# Patient Record
Sex: Male | Born: 1970 | Race: White | Hispanic: No | Marital: Married | Smoking: Former smoker
Health system: Southern US, Community
[De-identification: ages and names within clinical notes are randomized; demographics above are authoritative.]

## PROBLEM LIST (undated history)

## (undated) DIAGNOSIS — I10 Essential (primary) hypertension: Secondary | ICD-10-CM

## (undated) DIAGNOSIS — F319 Bipolar disorder, unspecified: Secondary | ICD-10-CM

## (undated) DIAGNOSIS — K219 Gastro-esophageal reflux disease without esophagitis: Secondary | ICD-10-CM

## (undated) DIAGNOSIS — F418 Other specified anxiety disorders: Secondary | ICD-10-CM

## (undated) DIAGNOSIS — F1010 Alcohol abuse, uncomplicated: Secondary | ICD-10-CM

## (undated) DIAGNOSIS — E7800 Pure hypercholesterolemia, unspecified: Secondary | ICD-10-CM

## (undated) HISTORY — PX: TONSILLECTOMY: SUR1361

---

## 2017-11-26 ENCOUNTER — Ambulatory Visit
Admission: EM | Admit: 2017-11-26 | Discharge: 2017-11-26 | Disposition: A | Discharge status: Home / Self Care | Payer: 59 | Attending: Family Medicine | Admitting: Family Medicine

## 2017-11-26 ENCOUNTER — Encounter: Payer: Self-pay | Admitting: *Deleted

## 2017-11-26 DIAGNOSIS — J029 Acute pharyngitis, unspecified: Secondary | ICD-10-CM | POA: Diagnosis not present

## 2017-11-26 DIAGNOSIS — H9202 Otalgia, left ear: Secondary | ICD-10-CM

## 2017-11-26 HISTORY — DX: Bipolar disorder, unspecified: F31.9

## 2017-11-26 LAB — RAPID STREP SCREEN (MED CTR MEBANE ONLY): Streptococcus, Group A Screen (Direct): NEGATIVE

## 2017-11-26 MED ORDER — AMOXICILLIN-POT CLAVULANATE 875-125 MG PO TABS: 1.0000 | ORAL_TABLET | Freq: Two times a day (BID) | ORAL | 0 refills | Status: DC

## 2017-11-26 NOTE — ED Provider Notes (Signed)
[facility]    CSN: [id number] Arrival date & time: 11/26/17  0911     History   Chief Complaint Chief Complaint  Patient presents with  . Sore Throat  . Otalgia    HPI Jesse Garrett is a 47 y.o. male.   HPI  47 year old male presents with a sore throat and left-sided ear pain for 4-5 days.  She does not have any discomfort on the right side.  Very sore throat hurts to swallow.  In addition he has had pain over his lower gum and jaw line.  Had no fever or chills.  Mother was seen here yesterday and diagnosed with strep throat.  He does stay with her 4 days a week.  Has been using ibuprofen 800 mg throughout 3 times daily.      Past Medical History:  Diagnosis Date  . Bipolar 1 disorder (HCC)     There are no active problems to display for this patient.   Past Surgical History:  Procedure Laterality Date  . TONSILLECTOMY         Home Medications    Prior to Admission medications   Medication Sig Start Date End Date Taking? Authorizing Provider  gabapentin (NEURONTIN) 300 MG capsule Take 900 mg by mouth 3 (three) times daily.   Yes [provider]  lamoTRIgine (LAMICTAL) 25 MG tablet Take 50 mg by mouth daily.   Yes [provider]  zolpidem (AMBIEN) 5 MG tablet Take 10 mg by mouth at bedtime as needed for sleep.   Yes [provider]  amoxicillin-clavulanate (AUGMENTIN) 875-125 MG tablet Take 1 tablet by mouth every 12 (twelve) hours. 11/26/17   Lutricia Feiloemer, Alverta Caccamo P, PA-C    Family History Family History  Problem Relation Age of Onset  . Diabetes Mother   . Hypertension Mother   . Cancer Father     Social History Social History   Tobacco Use  . Smoking status: Former Games developermoker  . Smokeless tobacco: Never Used  Substance Use Topics  . Alcohol use: No    Frequency: Never  . Drug use: No     Allergies   Patient has no known allergies.   Review of Systems Review of Systems  Constitutional: Positive for activity  change. Negative for chills, fatigue and fever.  HENT: Positive for ear pain, facial swelling and sore throat.   Respiratory: Negative for cough.   All other systems reviewed and are negative.    Physical Exam Triage Vital Signs ED Triage Vitals  Enc Vitals Group     BP 11/26/17 0935 137/87     Pulse Rate 11/26/17 0935 84     Resp 11/26/17 0935 16     Temp 11/26/17 0935 (!) 97.5 F (36.4 C)     Temp Source 11/26/17 0935 Oral     SpO2 11/26/17 0935 100 %     Weight 11/26/17 0938 235 lb (106.6 kg)     Height 11/26/17 0938 6' (1.829 m)     Head Circumference --      Peak Flow --      Pain Score 11/26/17 1102 0     Pain Loc --      Pain Edu? --      Excl. in GC? --    No data found.  Updated Vital Signs BP 137/87 (BP Location: Left Arm)   Pulse 84   Temp (!) 97.5 F (36.4 C) (Oral)   Resp 16   Ht 6' (1.829 m)  Wt 235 lb (106.6 kg)   SpO2 100%   BMI 31.87 kg/m   Visual Acuity Right Eye Distance:   Left Eye Distance:   Bilateral Distance:    Right Eye Near:   Left Eye Near:    Bilateral Near:     Physical Exam  Constitutional: He is oriented to person, place, and time. He appears well-developed and well-nourished.  Non-toxic appearance. He does not appear ill. No distress.  HENT:  Head: Normocephalic.  Right Ear: Hearing normal.  Left Ear: Hearing normal.  Mouth/Throat: Oropharynx is clear and moist and mucous membranes are normal. No oral lesions. No uvula swelling. No oropharyngeal exudate, posterior oropharyngeal edema, posterior oropharyngeal erythema or tonsillar abscesses. Tonsils are 1+ on the right. Tonsils are 1+ on the left. No tonsillar exudate.  TMs have sclerosis which is much worse on the left.  Right TM has bullae on TM.  Patient does have tenderness along the lower jaw line on the left and mild swelling appreciated of the left cheek.  No obvious abscess is seen.  Teeth are in fairly good repair with several caries noted scattered.  Eyes: Pupils are  equal, round, and reactive to light.  Neck: Normal range of motion. Neck supple.  Pulmonary/Chest: Effort normal and breath sounds normal.  Lymphadenopathy:    He has no cervical adenopathy.  Neurological: He is alert and oriented to person, place, and time.  Skin: Skin is warm and dry.  Psychiatric: He has a normal mood and affect. His behavior is normal.  Nursing note and vitals reviewed.    UC Treatments / Results  Labs (all labs ordered are listed, but only abnormal results are displayed) Labs Reviewed  RAPID STREP SCREEN (NOT AT [facility])  CULTURE, GROUP A STREP [facility])    EKG  EKG Interpretation None       Radiology No results found.  Procedures Procedures (including critical care time)  Medications Ordered in UC Medications - No data to display   Initial Impression / Assessment and Plan / UC Course  I have reviewed the triage vital signs and the nursing notes.  Pertinent labs & imaging results that were available during my care of the patient were reviewed by me and considered in my medical decision making (see chart for details).     Plan: 1. Test/x-ray results and diagnosis reviewed with patient 2. rx as per orders; risks, benefits, potential side effects reviewed with patient 3. Recommend supportive treatment with rest fluids and continued use of ibuprofen 800 mg 3 times a day with food.  Because of his exposure to strep with his mother also the tenderness along the jawline on the left I will start him on Augmentin empirically awaiting results of the pharyngeal cultures and sensitivities. 4. F/u prn if symptoms worsen or don't improve   Final Clinical Impressions(s) / UC Diagnoses   Final diagnoses:  Sore throat    ED Discharge Orders        Ordered    amoxicillin-clavulanate (AUGMENTIN) 875-125 MG tablet  Every 12 hours     11/26/17 1059       Controlled Substance Prescriptions [place] Controlled Substance Registry consulted? Not Applicable     Lutricia Feil, PA-C 11/26/17 2049

## 2017-11-26 NOTE — ED Triage Notes (Signed)
Sore throat and left ear pain x4-5 days.

## 2017-11-28 LAB — CULTURE, GROUP A STREP (THRC)

## 2020-08-12 ENCOUNTER — Emergency Department: Payer: Medicaid Other

## 2020-08-12 ENCOUNTER — Inpatient Hospital Stay: Payer: Medicaid Other

## 2020-08-12 ENCOUNTER — Inpatient Hospital Stay
Admission: EM | Admit: 2020-08-12 | Discharge: 2020-08-15 | DRG: 471 | Disposition: A | Discharge status: Home Health | Payer: Medicaid Other | Attending: Internal Medicine | Admitting: Internal Medicine

## 2020-08-12 ENCOUNTER — Other Ambulatory Visit: Payer: Self-pay

## 2020-08-12 DIAGNOSIS — K219 Gastro-esophageal reflux disease without esophagitis: Secondary | ICD-10-CM | POA: Diagnosis present

## 2020-08-12 DIAGNOSIS — F419 Anxiety disorder, unspecified: Secondary | ICD-10-CM | POA: Diagnosis present

## 2020-08-12 DIAGNOSIS — Z8249 Family history of ischemic heart disease and other diseases of the circulatory system: Secondary | ICD-10-CM

## 2020-08-12 DIAGNOSIS — M542 Cervicalgia: Secondary | ICD-10-CM | POA: Diagnosis present

## 2020-08-12 DIAGNOSIS — S12500A Unspecified displaced fracture of sixth cervical vertebra, initial encounter for closed fracture: Principal | ICD-10-CM | POA: Diagnosis present

## 2020-08-12 DIAGNOSIS — I959 Hypotension, unspecified: Secondary | ICD-10-CM | POA: Diagnosis present

## 2020-08-12 DIAGNOSIS — F1010 Alcohol abuse, uncomplicated: Secondary | ICD-10-CM | POA: Diagnosis present

## 2020-08-12 DIAGNOSIS — Z91030 Bee allergy status: Secondary | ICD-10-CM

## 2020-08-12 DIAGNOSIS — E785 Hyperlipidemia, unspecified: Secondary | ICD-10-CM | POA: Diagnosis present

## 2020-08-12 DIAGNOSIS — S62001A Unspecified fracture of navicular [scaphoid] bone of right wrist, initial encounter for closed fracture: Secondary | ICD-10-CM | POA: Diagnosis present

## 2020-08-12 DIAGNOSIS — S2232XA Fracture of one rib, left side, initial encounter for closed fracture: Secondary | ICD-10-CM | POA: Diagnosis present

## 2020-08-12 DIAGNOSIS — F319 Bipolar disorder, unspecified: Secondary | ICD-10-CM | POA: Diagnosis present

## 2020-08-12 DIAGNOSIS — S12600A Unspecified displaced fracture of seventh cervical vertebra, initial encounter for closed fracture: Secondary | ICD-10-CM | POA: Diagnosis present

## 2020-08-12 DIAGNOSIS — Z79899 Other long term (current) drug therapy: Secondary | ICD-10-CM

## 2020-08-12 DIAGNOSIS — F10120 Alcohol abuse with intoxication, uncomplicated: Secondary | ICD-10-CM | POA: Diagnosis present

## 2020-08-12 DIAGNOSIS — Z20822 Contact with and (suspected) exposure to covid-19: Secondary | ICD-10-CM | POA: Diagnosis present

## 2020-08-12 DIAGNOSIS — Z833 Family history of diabetes mellitus: Secondary | ICD-10-CM | POA: Diagnosis not present

## 2020-08-12 DIAGNOSIS — Y92410 Unspecified street and highway as the place of occurrence of the external cause: Secondary | ICD-10-CM

## 2020-08-12 DIAGNOSIS — S12601A Unspecified nondisplaced fracture of seventh cervical vertebra, initial encounter for closed fracture: Secondary | ICD-10-CM | POA: Diagnosis not present

## 2020-08-12 DIAGNOSIS — M532X2 Spinal instabilities, cervical region: Secondary | ICD-10-CM | POA: Diagnosis present

## 2020-08-12 DIAGNOSIS — Z87891 Personal history of nicotine dependence: Secondary | ICD-10-CM

## 2020-08-12 DIAGNOSIS — J9601 Acute respiratory failure with hypoxia: Secondary | ICD-10-CM | POA: Diagnosis present

## 2020-08-12 DIAGNOSIS — E876 Hypokalemia: Secondary | ICD-10-CM | POA: Diagnosis not present

## 2020-08-12 DIAGNOSIS — G9600 Cerebrospinal fluid leak, unspecified: Secondary | ICD-10-CM | POA: Diagnosis present

## 2020-08-12 DIAGNOSIS — D72829 Elevated white blood cell count, unspecified: Secondary | ICD-10-CM | POA: Diagnosis present

## 2020-08-12 DIAGNOSIS — Y908 Blood alcohol level of 240 mg/100 ml or more: Secondary | ICD-10-CM | POA: Diagnosis present

## 2020-08-12 DIAGNOSIS — Z981 Arthrodesis status: Secondary | ICD-10-CM

## 2020-08-12 DIAGNOSIS — S62009A Unspecified fracture of navicular [scaphoid] bone of unspecified wrist, initial encounter for closed fracture: Secondary | ICD-10-CM | POA: Diagnosis present

## 2020-08-12 DIAGNOSIS — I10 Essential (primary) hypertension: Secondary | ICD-10-CM

## 2020-08-12 DIAGNOSIS — S12400A Unspecified displaced fracture of fifth cervical vertebra, initial encounter for closed fracture: Secondary | ICD-10-CM | POA: Diagnosis present

## 2020-08-12 DIAGNOSIS — Z809 Family history of malignant neoplasm, unspecified: Secondary | ICD-10-CM

## 2020-08-12 DIAGNOSIS — E7800 Pure hypercholesterolemia, unspecified: Secondary | ICD-10-CM | POA: Diagnosis present

## 2020-08-12 DIAGNOSIS — F418 Other specified anxiety disorders: Secondary | ICD-10-CM | POA: Diagnosis not present

## 2020-08-12 DIAGNOSIS — S022XXA Fracture of nasal bones, initial encounter for closed fracture: Secondary | ICD-10-CM | POA: Diagnosis present

## 2020-08-12 DIAGNOSIS — S12591A Other nondisplaced fracture of sixth cervical vertebra, initial encounter for closed fracture: Secondary | ICD-10-CM

## 2020-08-12 DIAGNOSIS — Z419 Encounter for procedure for purposes other than remedying health state, unspecified: Secondary | ICD-10-CM

## 2020-08-12 DIAGNOSIS — S129XXA Fracture of neck, unspecified, initial encounter: Secondary | ICD-10-CM | POA: Diagnosis present

## 2020-08-12 HISTORY — DX: Pure hypercholesterolemia, unspecified: E78.00

## 2020-08-12 HISTORY — DX: Alcohol abuse, uncomplicated: F10.10

## 2020-08-12 HISTORY — DX: Essential (primary) hypertension: I10

## 2020-08-12 HISTORY — DX: Other specified anxiety disorders: F41.8

## 2020-08-12 HISTORY — DX: Gastro-esophageal reflux disease without esophagitis: K21.9

## 2020-08-12 LAB — URINALYSIS, COMPLETE (UACMP) WITH MICROSCOPIC
Bacteria, UA: NONE SEEN
Bilirubin Urine: NEGATIVE
Glucose, UA: NEGATIVE mg/dL
Hgb urine dipstick: NEGATIVE
Ketones, ur: NEGATIVE mg/dL
Leukocytes,Ua: NEGATIVE
Nitrite: NEGATIVE
Protein, ur: NEGATIVE mg/dL
Specific Gravity, Urine: 1.031 — ABNORMAL HIGH (ref 1.005–1.030)
Squamous Epithelial / LPF: NONE SEEN (ref 0–5)
pH: 8 (ref 5.0–8.0)

## 2020-08-12 LAB — CBC
HCT: 41.2 % (ref 39.0–52.0)
Hemoglobin: 14.1 g/dL (ref 13.0–17.0)
MCH: 33.1 pg (ref 26.0–34.0)
MCHC: 34.2 g/dL (ref 30.0–36.0)
MCV: 96.7 fL (ref 80.0–100.0)
Platelets: 322 10*3/uL (ref 150–400)
RBC: 4.26 MIL/uL (ref 4.22–5.81)
RDW: 13 % (ref 11.5–15.5)
WBC: 11.3 10*3/uL — ABNORMAL HIGH (ref 4.0–10.5)
nRBC: 0 % (ref 0.0–0.2)

## 2020-08-12 LAB — COMPREHENSIVE METABOLIC PANEL
ALT: 31 U/L (ref 0–44)
AST: 65 U/L — ABNORMAL HIGH (ref 15–41)
Albumin: 4.2 g/dL (ref 3.5–5.0)
Alkaline Phosphatase: 54 U/L (ref 38–126)
Anion gap: 9 (ref 5–15)
BUN: 10 mg/dL (ref 6–20)
CO2: 24 mmol/L (ref 22–32)
Calcium: 9.8 mg/dL (ref 8.9–10.3)
Chloride: 104 mmol/L (ref 98–111)
Creatinine, Ser: 1.22 mg/dL (ref 0.61–1.24)
GFR, Estimated: >60 mL/min (ref >60)
Glucose, Bld: 107 mg/dL — ABNORMAL HIGH (ref 70–99)
Potassium: 3.7 mmol/L (ref 3.5–5.1)
Sodium: 137 mmol/L (ref 135–145)
Total Bilirubin: 0.5 mg/dL (ref 0.3–1.2)
Total Protein: 7.7 g/dL (ref 6.5–8.1)

## 2020-08-12 LAB — ETHANOL: Alcohol, Ethyl (B): 317 mg/dL (ref <10)

## 2020-08-12 LAB — APTT: aPTT: 29 seconds (ref 24–36)

## 2020-08-12 LAB — TYPE AND SCREEN
ABO/RH(D): O NEG
Antibody Screen: NEGATIVE

## 2020-08-12 LAB — PROTIME-INR
INR: 1 (ref 0.8–1.2)
Prothrombin Time: 13.2 seconds (ref 11.4–15.2)

## 2020-08-12 LAB — RESP PANEL BY RT-PCR (FLU A&B, COVID) ARPGX2
Influenza A by PCR: NEGATIVE
Influenza B by PCR: NEGATIVE
SARS Coronavirus 2 by RT PCR: NEGATIVE

## 2020-08-12 LAB — GLUCOSE, CAPILLARY: Glucose-Capillary: 71 mg/dL (ref 70–99)

## 2020-08-12 LAB — MRSA PCR SCREENING: MRSA by PCR: NEGATIVE

## 2020-08-12 MED ORDER — ZOLPIDEM TARTRATE 5 MG PO TABS
10.0000 mg | ORAL_TABLET | Freq: Every evening | ORAL | Status: DC | PRN
  Administered 2020-08-12 – 2020-08-14 (×3): 10 mg via ORAL
  Filled 2020-08-12 (×3): qty 2

## 2020-08-12 MED ORDER — CLONAZEPAM 0.5 MG PO TABS: 0.5000 mg | ORAL_TABLET | Freq: Two times a day (BID) | ORAL | Status: DC | PRN

## 2020-08-12 MED ORDER — MORPHINE SULFATE (PF) 2 MG/ML IV SOLN
2.0000 mg | INTRAVENOUS | Status: DC | PRN
  Administered 2020-08-12 – 2020-08-14 (×8): 2 mg via INTRAVENOUS
  Filled 2020-08-12 (×8): qty 1

## 2020-08-12 MED ORDER — LORAZEPAM 2 MG/ML IJ SOLN: 0.0000 mg | Freq: Four times a day (QID) | INTRAMUSCULAR | Status: AC

## 2020-08-12 MED ORDER — ALBUTEROL SULFATE (2.5 MG/3ML) 0.083% IN NEBU: 2.5000 mg | INHALATION_SOLUTION | RESPIRATORY_TRACT | Status: DC | PRN

## 2020-08-12 MED ORDER — OXYCODONE-ACETAMINOPHEN 5-325 MG PO TABS
1.0000 | ORAL_TABLET | ORAL | Status: DC | PRN
  Administered 2020-08-13 – 2020-08-15 (×12): 1 via ORAL
  Filled 2020-08-12 (×12): qty 1

## 2020-08-12 MED ORDER — ADULT MULTIVITAMIN W/MINERALS CH
1.0000 | ORAL_TABLET | Freq: Every day | ORAL | Status: DC
  Administered 2020-08-13 – 2020-08-15 (×3): 1 via ORAL
  Filled 2020-08-12 (×3): qty 1

## 2020-08-12 MED ORDER — ONDANSETRON HCL 4 MG/2ML IJ SOLN: 4.0000 mg | Freq: Three times a day (TID) | INTRAMUSCULAR | Status: DC | PRN

## 2020-08-12 MED ORDER — IOHEXOL 300 MG/ML  SOLN
100.0000 mL | Freq: Once | INTRAMUSCULAR | Status: AC | PRN
  Administered 2020-08-12: 100 mL via INTRAVENOUS

## 2020-08-12 MED ORDER — FOLIC ACID 1 MG PO TABS
1.0000 mg | ORAL_TABLET | Freq: Every day | ORAL | Status: DC
  Administered 2020-08-13 – 2020-08-15 (×3): 1 mg via ORAL
  Filled 2020-08-12 (×3): qty 1

## 2020-08-12 MED ORDER — IOHEXOL 350 MG/ML SOLN
60.0000 mL | Freq: Once | INTRAVENOUS | Status: AC | PRN
  Administered 2020-08-12: 60 mL via INTRAVENOUS

## 2020-08-12 MED ORDER — THIAMINE HCL 100 MG/ML IJ SOLN: 100.0000 mg | Freq: Every day | INTRAMUSCULAR | Status: DC

## 2020-08-12 MED ORDER — LORAZEPAM 2 MG/ML IJ SOLN: 1.0000 mg | INTRAMUSCULAR | Status: DC | PRN

## 2020-08-12 MED ORDER — SODIUM CHLORIDE 0.9 % IV SOLN: INTRAVENOUS | Status: DC

## 2020-08-12 MED ORDER — LORAZEPAM 2 MG/ML IJ SOLN: 0.0000 mg | Freq: Two times a day (BID) | INTRAMUSCULAR | Status: DC

## 2020-08-12 MED ORDER — THIAMINE HCL 100 MG PO TABS
100.0000 mg | ORAL_TABLET | Freq: Every day | ORAL | Status: DC
  Administered 2020-08-13 – 2020-08-15 (×3): 100 mg via ORAL
  Filled 2020-08-12 (×3): qty 1

## 2020-08-12 MED ORDER — LORAZEPAM 1 MG PO TABS: 1.0000 mg | ORAL_TABLET | ORAL | Status: DC | PRN

## 2020-08-12 MED ORDER — FENOFIBRATE 160 MG PO TABS
160.0000 mg | ORAL_TABLET | Freq: Every day | ORAL | Status: DC
  Administered 2020-08-13 – 2020-08-15 (×3): 160 mg via ORAL
  Filled 2020-08-12 (×3): qty 1

## 2020-08-12 MED ORDER — ACETAMINOPHEN 325 MG PO TABS: 650.0000 mg | ORAL_TABLET | Freq: Four times a day (QID) | ORAL | Status: DC | PRN

## 2020-08-12 MED ORDER — HYDRALAZINE HCL 20 MG/ML IJ SOLN
5.0000 mg | INTRAMUSCULAR | Status: DC | PRN
  Administered 2020-08-14: 5 mg via INTRAVENOUS
  Filled 2020-08-12: qty 1

## 2020-08-12 MED ORDER — DOXEPIN HCL 100 MG PO CAPS
100.0000 mg | ORAL_CAPSULE | Freq: Every day | ORAL | Status: DC
  Administered 2020-08-12 – 2020-08-14 (×3): 100 mg via ORAL
  Filled 2020-08-12 (×5): qty 1

## 2020-08-12 MED ORDER — LITHIUM CARBONATE ER 450 MG PO TBCR
450.0000 mg | EXTENDED_RELEASE_TABLET | Freq: Every day | ORAL | Status: DC
  Administered 2020-08-12 – 2020-08-14 (×3): 450 mg via ORAL
  Filled 2020-08-12 (×5): qty 1

## 2020-08-12 MED ORDER — SODIUM CHLORIDE 0.9 % IV BOLUS
1000.0000 mL | Freq: Once | INTRAVENOUS | Status: AC
  Administered 2020-08-12: 1000 mL via INTRAVENOUS

## 2020-08-12 MED ORDER — TRAZODONE HCL 100 MG PO TABS
200.0000 mg | ORAL_TABLET | Freq: Every evening | ORAL | Status: DC | PRN
  Administered 2020-08-12: 200 mg via ORAL
  Filled 2020-08-12: qty 2

## 2020-08-12 MED ORDER — ROSUVASTATIN CALCIUM 5 MG PO TABS
5.0000 mg | ORAL_TABLET | Freq: Every day | ORAL | Status: DC
  Administered 2020-08-12 – 2020-08-14 (×3): 5 mg via ORAL
  Filled 2020-08-12 (×3): qty 1

## 2020-08-12 MED ORDER — PANTOPRAZOLE SODIUM 40 MG PO TBEC
40.0000 mg | DELAYED_RELEASE_TABLET | Freq: Two times a day (BID) | ORAL | Status: DC
  Administered 2020-08-12 – 2020-08-15 (×6): 40 mg via ORAL
  Filled 2020-08-12 (×6): qty 1
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
## 2020-08-12 NOTE — ED Notes (Signed)
Pt returned from CT and MRI; hospitalist at bedside
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
## 2020-08-12 NOTE — H&P (Signed)
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  Anticipate discharge back to previous environment Consults called:  Dr. Myer Haff of neurosurgery Admission status:  SDU/inpation        Status is: Inpatient  Remains inpatient appropriate because:Inpatient level of care appropriate due to severity of illness   Dispo: The patient is from: Home              Anticipated d/c is to: Home              Anticipated d/c date is: 2 days              Patient currently is not medically stable to d/c.          Date of Service 08/12/2020    Lorretta Harp [facility]   If 7PM-7AM, please contact night-coverage [email or web address] 08/12/2020, 4:47 PM
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
## 2020-08-12 NOTE — ED Notes (Signed)
Patient transported to CT 
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
## 2020-08-12 NOTE — ED Notes (Signed)
Pt's sats dropping to 87-89% while asleep. Initiated supplemental O2 per [place] at 2L

## 2020-08-12 NOTE — ED Notes (Addendum)
Yellow socks and band on pt. Call bell within reach and pt instructed not to get up without staff present
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
## 2020-08-12 NOTE — ED Notes (Signed)
Pt returned from CT; his mother is now at bedside

## 2020-08-13 ENCOUNTER — Inpatient Hospital Stay: Payer: Medicaid Other | Admitting: Anesthesiology

## 2020-08-13 ENCOUNTER — Encounter
Admission: EM | Disposition: A | Discharge status: Home Health | Payer: Self-pay | Source: Home / Self Care | Attending: Internal Medicine

## 2020-08-13 ENCOUNTER — Inpatient Hospital Stay: Payer: Medicaid Other

## 2020-08-13 ENCOUNTER — Encounter: Payer: Self-pay | Admitting: Internal Medicine

## 2020-08-13 DIAGNOSIS — S12601A Unspecified nondisplaced fracture of seventh cervical vertebra, initial encounter for closed fracture: Secondary | ICD-10-CM | POA: Diagnosis not present

## 2020-08-13 DIAGNOSIS — F1010 Alcohol abuse, uncomplicated: Secondary | ICD-10-CM | POA: Diagnosis not present

## 2020-08-13 DIAGNOSIS — J9601 Acute respiratory failure with hypoxia: Secondary | ICD-10-CM | POA: Diagnosis not present

## 2020-08-13 HISTORY — PX: ANTERIOR CERVICAL DECOMP/DISCECTOMY FUSION: SHX1161

## 2020-08-13 LAB — BASIC METABOLIC PANEL
Anion gap: 12 (ref 5–15)
BUN: 8 mg/dL (ref 6–20)
CO2: 18 mmol/L — ABNORMAL LOW (ref 22–32)
Calcium: 8.9 mg/dL (ref 8.9–10.3)
Chloride: 110 mmol/L (ref 98–111)
Creatinine, Ser: 0.86 mg/dL (ref 0.61–1.24)
GFR, Estimated: >60 mL/min (ref >60)
Glucose, Bld: 86 mg/dL (ref 70–99)
Potassium: 3.4 mmol/L — ABNORMAL LOW (ref 3.5–5.1)
Sodium: 140 mmol/L (ref 135–145)

## 2020-08-13 LAB — HIV ANTIBODY (ROUTINE TESTING W REFLEX): HIV Screen 4th Generation wRfx: NONREACTIVE

## 2020-08-13 LAB — CBC
HCT: 37.7 % — ABNORMAL LOW (ref 39.0–52.0)
Hemoglobin: 12.8 g/dL — ABNORMAL LOW (ref 13.0–17.0)
MCH: 33 pg (ref 26.0–34.0)
MCHC: 34 g/dL (ref 30.0–36.0)
MCV: 97.2 fL (ref 80.0–100.0)
Platelets: 258 10*3/uL (ref 150–400)
RBC: 3.88 MIL/uL — ABNORMAL LOW (ref 4.22–5.81)
RDW: 13.4 % (ref 11.5–15.5)
WBC: 10.5 10*3/uL (ref 4.0–10.5)
nRBC: 0 % (ref 0.0–0.2)

## 2020-08-13 SURGERY — ANTERIOR CERVICAL DECOMPRESSION/DISCECTOMY FUSION 2 LEVELS
Anesthesia: General

## 2020-08-13 MED ORDER — PROPOFOL 10 MG/ML IV BOLUS
INTRAVENOUS | Status: DC | PRN
  Administered 2020-08-13: 200 mg via INTRAVENOUS
  Administered 2020-08-13: 100 mg via INTRAVENOUS
  Administered 2020-08-13: 50 mg via INTRAVENOUS

## 2020-08-13 MED ORDER — PROPOFOL 500 MG/50ML IV EMUL
INTRAVENOUS | Status: DC | PRN
  Administered 2020-08-13: 100 ug/kg/min via INTRAVENOUS

## 2020-08-13 MED ORDER — FENTANYL CITRATE (PF) 100 MCG/2ML IJ SOLN
INTRAMUSCULAR | Status: DC | PRN
  Administered 2020-08-13 (×3): 50 ug via INTRAVENOUS
  Administered 2020-08-13: 100 ug via INTRAVENOUS
  Administered 2020-08-13 (×2): 50 ug via INTRAVENOUS

## 2020-08-13 MED ORDER — ACETAMINOPHEN 10 MG/ML IV SOLN
INTRAVENOUS | Status: DC | PRN
  Administered 2020-08-13: 1000 mg via INTRAVENOUS

## 2020-08-13 MED ORDER — FIBRIN SEALANT 2 ML SINGLE DOSE KIT
PACK | CUTANEOUS | Status: DC | PRN
  Administered 2020-08-13: 2 mL via TOPICAL

## 2020-08-13 MED ORDER — ROCURONIUM BROMIDE 100 MG/10ML IV SOLN
INTRAVENOUS | Status: DC | PRN
  Administered 2020-08-13: 10 mg via INTRAVENOUS
  Administered 2020-08-13: 50 mg via INTRAVENOUS
  Administered 2020-08-13: 10 mg via INTRAVENOUS

## 2020-08-13 MED ORDER — FENTANYL CITRATE (PF) 100 MCG/2ML IJ SOLN
INTRAMUSCULAR | Status: AC
  Filled 2020-08-13: qty 2

## 2020-08-13 MED ORDER — METOPROLOL TARTRATE 5 MG/5ML IV SOLN
INTRAVENOUS | Status: AC
  Filled 2020-08-13: qty 5

## 2020-08-13 MED ORDER — FENTANYL CITRATE (PF) 250 MCG/5ML IJ SOLN
INTRAMUSCULAR | Status: AC
  Filled 2020-08-13: qty 5

## 2020-08-13 MED ORDER — THROMBIN 5000 UNITS EX SOLR
CUTANEOUS | Status: DC | PRN
  Administered 2020-08-13: 5000 [IU] via TOPICAL

## 2020-08-13 MED ORDER — DEXAMETHASONE SODIUM PHOSPHATE 10 MG/ML IJ SOLN
INTRAMUSCULAR | Status: DC | PRN
  Administered 2020-08-13: 10 mg via INTRAVENOUS

## 2020-08-13 MED ORDER — LIDOCAINE HCL (CARDIAC) PF 100 MG/5ML IV SOSY
PREFILLED_SYRINGE | INTRAVENOUS | Status: DC | PRN
  Administered 2020-08-13: 80 mg via INTRAVENOUS

## 2020-08-13 MED ORDER — POTASSIUM CHLORIDE 10 MEQ/100ML IV SOLN
10.0000 meq | INTRAVENOUS | Status: AC
  Administered 2020-08-13 (×2): 10 meq via INTRAVENOUS
  Filled 2020-08-13 (×3): qty 100

## 2020-08-13 MED ORDER — BUPIVACAINE-EPINEPHRINE (PF) 0.5% -1:200000 IJ SOLN
INTRAMUSCULAR | Status: DC | PRN
  Administered 2020-08-13: 4 mL

## 2020-08-13 MED ORDER — SODIUM CHLORIDE 0.9 % IV SOLN: INTRAVENOUS | Status: DC | PRN

## 2020-08-13 MED ORDER — ONDANSETRON HCL 4 MG/2ML IJ SOLN
INTRAMUSCULAR | Status: DC | PRN
  Administered 2020-08-13: 8 mg via INTRAVENOUS

## 2020-08-13 MED ORDER — MIDAZOLAM HCL 2 MG/2ML IJ SOLN
INTRAMUSCULAR | Status: AC
  Filled 2020-08-13: qty 2

## 2020-08-13 MED ORDER — ONDANSETRON HCL 4 MG/2ML IJ SOLN
INTRAMUSCULAR | Status: AC
  Filled 2020-08-13: qty 2

## 2020-08-13 MED ORDER — ACETAMINOPHEN 10 MG/ML IV SOLN
INTRAVENOUS | Status: AC
  Filled 2020-08-13: qty 100

## 2020-08-13 MED ORDER — CHLORHEXIDINE GLUCONATE CLOTH 2 % EX PADS
6.0000 | MEDICATED_PAD | Freq: Every day | CUTANEOUS | Status: DC
  Administered 2020-08-13: 6 via TOPICAL

## 2020-08-13 MED ORDER — LIDOCAINE HCL (PF) 2 % IJ SOLN
INTRAMUSCULAR | Status: AC
  Filled 2020-08-13: qty 5

## 2020-08-13 MED ORDER — GLYCOPYRROLATE 0.2 MG/ML IJ SOLN
INTRAMUSCULAR | Status: AC
  Filled 2020-08-13: qty 1

## 2020-08-13 MED ORDER — SUCCINYLCHOLINE CHLORIDE 200 MG/10ML IV SOSY
PREFILLED_SYRINGE | INTRAVENOUS | Status: AC
  Filled 2020-08-13: qty 10

## 2020-08-13 MED ORDER — PROPOFOL 500 MG/50ML IV EMUL
INTRAVENOUS | Status: AC
  Filled 2020-08-13: qty 50

## 2020-08-13 MED ORDER — METOPROLOL TARTRATE 5 MG/5ML IV SOLN
INTRAVENOUS | Status: DC | PRN
  Administered 2020-08-13 (×2): 2.5 mg via INTRAVENOUS

## 2020-08-13 MED ORDER — ROCURONIUM BROMIDE 10 MG/ML (PF) SYRINGE
PREFILLED_SYRINGE | INTRAVENOUS | Status: AC
  Filled 2020-08-13: qty 10

## 2020-08-13 MED ORDER — DEXMEDETOMIDINE (PRECEDEX) IN NS 20 MCG/5ML (4 MCG/ML) IV SYRINGE
PREFILLED_SYRINGE | INTRAVENOUS | Status: DC | PRN
  Administered 2020-08-13: 12 ug via INTRAVENOUS
  Administered 2020-08-13: 8 ug via INTRAVENOUS

## 2020-08-13 MED ORDER — DEXAMETHASONE SODIUM PHOSPHATE 10 MG/ML IJ SOLN
INTRAMUSCULAR | Status: AC
  Filled 2020-08-13: qty 1

## 2020-08-13 MED ORDER — SUGAMMADEX SODIUM 200 MG/2ML IV SOLN
INTRAVENOUS | Status: DC | PRN
  Administered 2020-08-13: 200 mg via INTRAVENOUS

## 2020-08-13 MED ORDER — GLYCOPYRROLATE 0.2 MG/ML IJ SOLN
INTRAMUSCULAR | Status: DC | PRN
  Administered 2020-08-13: .2 mg via INTRAVENOUS

## 2020-08-13 MED ORDER — SUCCINYLCHOLINE CHLORIDE 20 MG/ML IJ SOLN
INTRAMUSCULAR | Status: DC | PRN
  Administered 2020-08-13: 100 mg via INTRAVENOUS
  Administered 2020-08-13: 60 mg via INTRAVENOUS

## 2020-08-13 MED ORDER — CEFAZOLIN SODIUM-DEXTROSE 2-3 GM-%(50ML) IV SOLR
INTRAVENOUS | Status: DC | PRN
  Administered 2020-08-13: 2 g via INTRAVENOUS

## 2020-08-13 SURGICAL SUPPLY — 68 items
BASKET BONE COLLECTION (BASKET) IMPLANT
BIT DRILL SPINE QC 14 (BIT) ×1 IMPLANT
BULB RESERV EVAC DRAIN JP 100C (MISCELLANEOUS) IMPLANT
BUR NEURO DRILL SOFT 3.0X3.8M (BURR) ×2 IMPLANT
CANISTER SUCT 1200ML W/VALVE (MISCELLANEOUS) ×4 IMPLANT
CHLORAPREP W/TINT 26 (MISCELLANEOUS) ×4 IMPLANT
COUNTER NEEDLE 20/40 LG (NEEDLE) ×2 IMPLANT
COVER WAND RF STERILE (DRAPES) ×2 IMPLANT
CUP MEDICINE 2OZ PLAST GRAD ST (MISCELLANEOUS) ×2 IMPLANT
DERMABOND ADVANCED (GAUZE/BANDAGES/DRESSINGS) ×1
DERMABOND ADVANCED .7 DNX12 (GAUZE/BANDAGES/DRESSINGS) ×1 IMPLANT
DRAIN CHANNEL JP 10F RND 20C F (MISCELLANEOUS) IMPLANT
DRAPE C ARM PK CFD 31 SPINE (DRAPES) ×1 IMPLANT
DRAPE C-ARM 42X72 X-RAY (DRAPES) ×2 IMPLANT
DRAPE LAPAROTOMY 77X122 PED (DRAPES) ×2 IMPLANT
DRAPE MICROSCOPE SPINE 48X150 (DRAPES) ×2 IMPLANT
DRAPE SURG 17X11 SM STRL (DRAPES) ×8 IMPLANT
ELECT CAUTERY BLADE TIP 2.5 (TIP) ×2
ELECT REM PT RETURN 9FT ADLT (ELECTROSURGICAL) ×2
ELECTRODE CAUTERY BLDE TIP 2.5 (TIP) ×1 IMPLANT
ELECTRODE REM PT RTRN 9FT ADLT (ELECTROSURGICAL) ×1 IMPLANT
FEE INTRAOP MONITOR IMPULS NCS (MISCELLANEOUS) IMPLANT
GLOVE BIOGEL PI IND STRL 7.0 (GLOVE) ×1 IMPLANT
GLOVE BIOGEL PI INDICATOR 7.0 (GLOVE) ×1
GLOVE SURG SYN 7.0 (GLOVE) ×4 IMPLANT
GLOVE SURG SYN 7.0 PF PI (GLOVE) ×2 IMPLANT
GLOVE SURG SYN 8.5  E (GLOVE) ×3
GLOVE SURG SYN 8.5 E (GLOVE) ×3 IMPLANT
GLOVE SURG SYN 8.5 PF PI (GLOVE) ×3 IMPLANT
GOWN SRG XL LVL 3 NONREINFORCE (GOWNS) ×1 IMPLANT
GOWN STRL NON-REIN TWL XL LVL3 (GOWNS) ×1
GOWN STRL REUS W/ TWL XL LVL3 (GOWN DISPOSABLE) ×1 IMPLANT
GOWN STRL REUS W/TWL XL LVL3 (GOWN DISPOSABLE) ×1
GRADUATE 1200CC STRL 31836 (MISCELLANEOUS) ×2 IMPLANT
HALTER HD/CHIN CERV TRACTION D (MISCELLANEOUS) ×1 IMPLANT
INTRAOP MONITOR FEE IMPULS NCS (MISCELLANEOUS)
INTRAOP MONITOR FEE IMPULSE (MISCELLANEOUS)
KIT TURNOVER KIT A (KITS) ×2 IMPLANT
MANIFOLD NEPTUNE II (INSTRUMENTS) ×2 IMPLANT
MARKER SKIN DUAL TIP RULER LAB (MISCELLANEOUS) ×4 IMPLANT
NDL SAFETY ECLIPSE 18X1.5 (NEEDLE) ×1 IMPLANT
NEEDLE HYPO 18GX1.5 SHARP (NEEDLE) ×1
NEEDLE HYPO 22GX1.5 SAFETY (NEEDLE) ×2 IMPLANT
NS IRRIG 1000ML POUR BTL (IV SOLUTION) ×2 IMPLANT
PACK LAMINECTOMY NEURO (CUSTOM PROCEDURE TRAY) ×2 IMPLANT
PAD ARMBOARD 7.5X6 YLW CONV (MISCELLANEOUS) ×2 IMPLANT
PIN CASPAR 14 (PIN) ×1 IMPLANT
PIN CASPAR 14MM (PIN) ×2 IMPLANT
PLATE ANT CERV XTEND 2 LV 36 (Plate) ×1 IMPLANT
PUTTY DBX 1CC (Putty) ×4 IMPLANT
PUTTY DBX 1CC DEPUY (Putty) IMPLANT
SCREW FXD 4.2 XTD SELF TAP 16 (Screw) ×2 IMPLANT
SCREW XTD VAR 4.2 SELF TAP 16 (Screw) ×2 IMPLANT
SCREW XTEND 4.6X16MM (Screw) ×1 IMPLANT
SCREW XTEND 4.6X18 (Screw) ×1 IMPLANT
SPACER HEDRON C 12X14X8 7D (Spacer) ×1 IMPLANT
SPACER HEDRON C 12X14X9 7D (Spacer) ×1 IMPLANT
SPOGE SURGIFLO 8M (HEMOSTASIS) ×1
SPONGE KITTNER 5P (MISCELLANEOUS) ×2 IMPLANT
SPONGE SURGIFLO 8M (HEMOSTASIS) ×1 IMPLANT
STAPLER SKIN PROX 35W (STAPLE) IMPLANT
SUT V-LOC 90 ABS DVC 3-0 CL (SUTURE) ×2 IMPLANT
SUT VIC AB 3-0 SH 8-18 (SUTURE) ×2 IMPLANT
SYR 30ML LL (SYRINGE) ×2 IMPLANT
TAPE CLOTH 3X10 WHT NS LF (GAUZE/BANDAGES/DRESSINGS) ×2 IMPLANT
TOWEL OR 17X26 4PK STRL BLUE (TOWEL DISPOSABLE) ×6 IMPLANT
TRAY FOLEY MTR SLVR 16FR STAT (SET/KITS/TRAYS/PACK) IMPLANT
TUBING CONNECTING 10 (TUBING) ×2 IMPLANT

## 2020-08-13 NOTE — Transfer of Care (Signed)
Immediate Anesthesia Transfer of Care Note  Patient: Jesse Garrett  Procedure(s) Performed: ANTERIOR CERVICAL DECOMPRESSION/DISCECTOMY FUSION 2 LEVELS (N/A )  Patient Location: PACU  Anesthesia Type:General  Level of Consciousness: awake, alert  and oriented  Airway & Oxygen Therapy: Patient Spontanous Breathing and Patient connected to face mask oxygen  Post-op Assessment: Report given to RN and Post -op Vital signs reviewed and stable  Post vital signs: Reviewed and stable  Last Vitals:  Vitals Value Taken Time  BP    Temp    Pulse    Resp    SpO2      Last Pain:  Vitals:   08/13/20 1458  TempSrc:   PainSc: Asleep      Patients Stated Pain Goal: 0 (08/12/20 2000)  Complications: No complications documented.

## 2020-08-13 NOTE — Interval H&P Note (Signed)
History and Physical Interval Note:  08/13/2020 2:40 PM  Jesse Garrett  has presented today for surgery, with the diagnosis of instability of cervical spine.  The various methods of treatment have been discussed with the patient and family. After consideration of risks, benefits and other options for treatment, the patient has consented to  Procedure(s): ANTERIOR CERVICAL DECOMPRESSION/DISCECTOMY FUSION 2 LEVELS (N/A) as a surgical intervention.  The patient's history has been reviewed, patient examined, no change in status, stable for surgery.  I have reviewed the patient's chart and labs.  Questions were answered to the patient's satisfaction.     Heart sounds normal no MRG. Chest Clear to Auscultation Bilaterally.   Osborn Pullin

## 2020-08-13 NOTE — Progress Notes (Signed)
Jesse Garrett is examined in ICU postoperatively. He is alert. He is moving all his extremities equally bilaterally. He denies any new numbness or tingling.  He must remain with HOB > 30 degrees at all times due to noted CSF leak.  He must remain in cervical collar x 3 months.  Patsey Berthold, NP [facility] Department of Neurosurgery
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
## 2020-08-13 NOTE — Progress Notes (Signed)
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 Musculoskeletal: LEFT second rib fracture. Signs of prior trauma to the LEFT hemithorax with healed fracture of the LEFT sixth rib posteriorly. Sternum is intact. Visualized clavicles and scapulae are intact. Spinal degenerative changes without thoracic or lumbar spine abnormality IMPRESSION: 1. Acute LEFT second rib fracture. 2. Some stranding about the RIGHT thoracic inlet at the level of C7 transverse process where there is fracture. Small amount of stranding along the RIGHT innominate artery and about the RIGHT vertebral artery. Injuries in the cervical spine above the image field of view for the CT of the chest abdomen and pelvis. Given pattern of injuries CT angiography of the neck may be helpful including the arch to assess the vertebral artery. Innominate artery could be further assessed at this time. 3. Basilar airspace disease atelectasis or contusion. 4. No evidence of acute traumatic injury to the abdomen or pelvis. 5. Jejunal  intussusception with Short-segment, is favored to represent a transient phenomenon despite being seen on venous and delayed phases. If the patient experiences continued abdominal pain follow-up CT could be performed. 6. Aortic atherosclerosis. Aortic Atherosclerosis (ICD10-I70.0). Electronically Signed   By: Donzetta Kohut M.D.   On: 08/12/2020 15:08   CT Maxillofacial Wo Contrast  Result Date: 08/12/2020 CLINICAL DATA:  Trauma. EXAM: CT HEAD WITHOUT CONTRAST CT MAXILLOFACIAL WITHOUT CONTRAST CT CERVICAL SPINE WITHOUT CONTRAST TECHNIQUE: Multidetector CT imaging of the head, cervical spine, and maxillofacial structures were performed using the standard protocol without intravenous contrast. Multiplanar CT image reconstructions of the cervical spine and maxillofacial structures were also generated. COMPARISON:  None. FINDINGS: CT HEAD FINDINGS Brain: No evidence of acute large vessel territory infarction, hemorrhage, hydrocephalus, extra-axial collection or mass lesion/mass effect. Mild patchy white matter hypoattenuation in the white matter, likely related to chronic microvascular ischemic disease. Vascular: No hyperdense vessel or unexpected calcification. Skull: No acute calvarial fracture. Other: No mastoid effusions. CT MAXILLOFACIAL FINDINGS Osseous: Mildly displaced fractures bilateral nasal bones. No other fractures identified. Orbits: Negative. No traumatic or inflammatory finding. Sinuses: The sinuses are clear. Soft tissues: Small left frontal scalp contusion. CT CERVICAL SPINE FINDINGS Alignment: Mild anterolisthesis of C6 on C7. Skull base and vertebrae: Unstable anterior dislocation of the right C6 facet with bilateral C6 lamina fractures, comminuted and extending to the C6-C7 facet joint on the right. Mild anterolisthesis of C6 on C7. Comminuted fracture of the right C7 lateral mass and right transverse process, extending to the facet joint and the right transverse foramen. Acute fracture of the  C5 spinous process which  extends to involve the left C5 lamina. Soft tissues and spinal canal: No prevertebral fluid or swelling. No large visible canal hematoma. Disc levels:  Mild multilevel degenerative disc disease. Upper chest: Please see concurrent CT chest abdomen pelvis for characterization of the superior mediastinum and chest. IMPRESSION: CT cervical spine: 1. Unstable anterior dislocation of the right C6 facet with bilateral C6 lamina fractures, comminuted and extending to the C6-C7 facet joint on the right. Mild anterolisthesis of C6 on C7, possibly traumatic. 2. Comminuted fracture of the right C7 lateral mass and right transverse process, extending to the facet joint and the right transverse foramen. Recommend CTA of the neck to evaluate for vertebral artery injury. 3. Acute fracture of the C5 spinous process which extends to involve the left C5 lamina. 4. Please see concurrent CT chest abdomen pelvis for characterization of the superior mediastinum and chest. CT head: 1. No evidence of acute intracranial abnormality. 2. Chronic microvascular ischemic disease. CT maxillofacial: Mildly displaced bilateral nasal bone fractures. No other fractures identified. Findings discussed with Dr. Katrinka Blazing at 3:09 PM via telephone. Electronically Signed   By: Feliberto Harts MD   On: 08/12/2020 15:19        Scheduled Meds:  Chlorhexidine Gluconate Cloth  6 each Topical Daily   doxepin  100 mg Oral QHS   fenofibrate  160 mg Oral Daily   folic acid  1 mg Oral Daily   lithium carbonate  450 mg Oral QHS   LORazepam  0-4 mg Intravenous Q6H   Followed by   Melene Muller ON 08/14/2020] LORazepam  0-4 mg Intravenous Q12H   multivitamin with minerals  1 tablet Oral Daily   pantoprazole  40 mg Oral BID   rosuvastatin  5 mg Oral Daily   thiamine  100 mg Oral Daily   Or   thiamine  100 mg Intravenous Daily   Continuous Infusions:  sodium chloride 100 mL/hr at 08/13/20 0500   potassium chloride        LOS: 1 day    Time spent: 27 minutes    Marrion Coy, MD [facility]   To contact the attending provider between 7A-7P or the covering provider during after hours 7P-7A, please log into the web site [email or web address] and access using universal [place] password for that web site. If you do not have the password, please call the hospital operator.  08/13/2020, 1:54 PM

## 2020-08-13 NOTE — Anesthesia Procedure Notes (Addendum)
Procedure Name: Intubation Date/Time: 08/13/2020 4:16 PM Performed by: Corinda Gubler, MD Pre-anesthesia Checklist: Patient identified, Patient being monitored, Timeout performed, Emergency Drugs available and Suction available Patient Re-evaluated:Patient Re-evaluated prior to induction Oxygen Delivery Method: Circle system utilized Preoxygenation: Pre-oxygenation with 100% oxygen Induction Type: IV induction Ventilation: Mask ventilation with difficulty and Two handed mask ventilation required Laryngoscope Size: McGraph and 4 Grade View: Grade III Tube type: Oral Tube size: 7.5 mm Number of attempts: 4 Airway Equipment and Method: Stylet,  Bougie stylet,  Video-laryngoscopy and Fiberoptic brochoscope Placement Confirmation: ETT inserted through vocal cords under direct vision,  positive ETCO2 and breath sounds checked- equal and bilateral Secured at: 22 cm Tube secured with: Tape Dental Injury: Teeth and Oropharynx as per pre-operative assessment  Difficulty Due To: Difficulty was anticipated, Difficult Airway- due to reduced neck mobility, Difficult Airway- due to cervical collar, Difficult Airway-  due to neck instability and Difficult Airway- due to anterior larynx Future Recommendations: Recommend- induction with short-acting agent, and alternative techniques readily available Comments: Patient placed on monitors, preox with 100% fio2, induction as per chart. Started with asleep fiberoptic intubation attempt with CRNA providing in-line stabilization of neck; difficult to visualize vocal cords due to excess soft tissue and secretions. Second attempt with Mcgrath 4 blade, grade 2b to grade 3 view, unsuccessful attempts with styletted ETT. Third attempt switching providers to [facility] MD, still unable to pass tube. Fourth attempt with driveable bougie successful, ETT passed over bougie into cords, confirmed placement with ETT and b/l breath sounds. No significant oral trauma. Cervical stabilization  provided entire time, Dr Marcell Barlow present at bedside throughout observing. Mask ventilation attempts in between attempts. After intubation, OGT placed and suctioned, ETT suctioned as well.

## 2020-08-13 NOTE — Anesthesia Postprocedure Evaluation (Signed)
Anesthesia Post Note  Patient: [name]  Procedure(s) Performed: ANTERIOR CERVICAL DECOMPRESSION/DISCECTOMY FUSION 2 LEVELS (N/A )  Patient location during evaluation: ICU Anesthesia Type: General Level of consciousness: awake and alert Pain management: pain level controlled Vital Signs Assessment: post-procedure vital signs reviewed and stable Respiratory status: spontaneous breathing, nonlabored ventilation, respiratory function stable and patient connected to nasal cannula oxygen Cardiovascular status: blood pressure returned to baseline and stable Postop Assessment: no apparent nausea or vomiting Anesthetic complications: no   No complications documented.   Last Vitals:  Vitals:   08/13/20 1859 08/13/20 2000  BP: (!) 166/88 (!) 151/104  Pulse: (!) 106 95  Resp:  14  Temp: 37.2 C   SpO2: 97% 98%    Last Pain:  Vitals:   08/13/20 2000  TempSrc:   PainSc: 7                  Corinda Gubler

## 2020-08-13 NOTE — Anesthesia Preprocedure Evaluation (Signed)
Anesthesia Evaluation  Patient identified by MRN, date of birth, ID band Patient awake    Reviewed: Allergy & Precautions, H&P , NPO status , Patient's Chart, lab work & pertinent test results  History of Anesthesia Complications Negative for: history of anesthetic complications  Airway Mallampati: III  TM Distance: >3 FB Neck ROM: limited    Dental  (+) Chipped   Pulmonary neg shortness of breath, former smoker,    Pulmonary exam normal        Cardiovascular Exercise Tolerance: Good hypertension, (-) angina(-) Past MI and (-) DOE Normal cardiovascular exam     Neuro/Psych negative neurological ROS  negative psych ROS   GI/Hepatic Neg liver ROS, GERD  Medicated and Controlled,  Endo/Other  negative endocrine ROS  Renal/GU      Musculoskeletal   Abdominal   Peds  Hematology negative hematology ROS (+)   Anesthesia Other Findings Past Medical History: No date: Alcohol abuse No date: Bipolar 1 disorder (HCC) No date: Depression with anxiety No date: GERD (gastroesophageal reflux disease) No date: HTN (hypertension) No date: Hypercholesteremia  Past Surgical History: No date: TONSILLECTOMY  BMI    Body Mass Index: 27.89 kg/m      Reproductive/Obstetrics negative OB ROS                             Anesthesia Physical Anesthesia Plan  ASA: III  Anesthesia Plan: General ETT   Post-op Pain Management:    Induction: Intravenous  PONV Risk Score and Plan: Ondansetron, Dexamethasone, Midazolam and Treatment may vary due to age or medical condition  Airway Management Planned: Oral ETT and Video Laryngoscope Planned  Additional Equipment:   Intra-op Plan:   Post-operative Plan: Extubation in OR  Informed Consent: I have reviewed the patients History and Physical, chart, labs and discussed the procedure including the risks, benefits and alternatives for the proposed  anesthesia with the patient or authorized representative who has indicated his/her understanding and acceptance.     Dental Advisory Given  Plan Discussed with: Anesthesiologist, CRNA and Surgeon  Anesthesia Plan Comments: (Patient consented for risks of anesthesia including but not limited to:  - adverse reactions to medications - damage to eyes, teeth, lips or other oral mucosa - nerve damage due to positioning  - sore throat or hoarseness - Damage to heart, brain, nerves, lungs, other parts of body or loss of life  Patient voiced understanding.)        Anesthesia Quick Evaluation

## 2020-08-13 NOTE — Plan of Care (Signed)
  Problem: Education: Goal: Knowledge of General Education information will improve Description: Including pain rating scale, medication(s)/side effects and non-pharmacologic comfort measures 08/13/2020 1821 by Williemae Natter, RN Outcome: Progressing 08/13/2020 1819 by Williemae Natter, RN Outcome: Progressing   Problem: Health Behavior/Discharge Planning: Goal: Ability to manage health-related needs will improve 08/13/2020 1821 by Williemae Natter, RN Outcome: Progressing 08/13/2020 1819 by Williemae Natter, RN Outcome: Progressing   Problem: Clinical Measurements: Goal: Ability to maintain clinical measurements within normal limits will improve Outcome: Progressing   Problem: Nutrition: Goal: Adequate nutrition will be maintained Outcome: Progressing   Problem: Coping: Goal: Level of anxiety will decrease Outcome: Progressing   Problem: Pain Managment: Goal: General experience of comfort will improve Outcome: Progressing   Problem: Safety: Goal: Ability to remain free from injury will improve Outcome: Progressing   Problem: Skin Integrity: Goal: Risk for impaired skin integrity will decrease Outcome: Progressing

## 2020-08-14 ENCOUNTER — Inpatient Hospital Stay: Payer: Medicaid Other

## 2020-08-14 DIAGNOSIS — J9601 Acute respiratory failure with hypoxia: Secondary | ICD-10-CM | POA: Diagnosis not present

## 2020-08-14 DIAGNOSIS — S12601A Unspecified nondisplaced fracture of seventh cervical vertebra, initial encounter for closed fracture: Secondary | ICD-10-CM | POA: Diagnosis not present

## 2020-08-14 DIAGNOSIS — F1010 Alcohol abuse, uncomplicated: Secondary | ICD-10-CM | POA: Diagnosis not present

## 2020-08-14 LAB — CBC WITH DIFFERENTIAL/PLATELET
Abs Immature Granulocytes: 0.07 10*3/uL (ref 0.00–0.07)
Basophils Absolute: 0 10*3/uL (ref 0.0–0.1)
Basophils Relative: 0 %
Eosinophils Absolute: 0 10*3/uL (ref 0.0–0.5)
Eosinophils Relative: 0 %
HCT: 37.8 % — ABNORMAL LOW (ref 39.0–52.0)
Hemoglobin: 12.9 g/dL — ABNORMAL LOW (ref 13.0–17.0)
Immature Granulocytes: 1 %
Lymphocytes Relative: 3 %
Lymphs Abs: 0.4 10*3/uL — ABNORMAL LOW (ref 0.7–4.0)
MCH: 33.1 pg (ref 26.0–34.0)
MCHC: 34.1 g/dL (ref 30.0–36.0)
MCV: 96.9 fL (ref 80.0–100.0)
Monocytes Absolute: 0.5 10*3/uL (ref 0.1–1.0)
Monocytes Relative: 5 %
Neutro Abs: 10.3 10*3/uL — ABNORMAL HIGH (ref 1.7–7.7)
Neutrophils Relative %: 91 %
Platelets: 248 10*3/uL (ref 150–400)
RBC: 3.9 MIL/uL — ABNORMAL LOW (ref 4.22–5.81)
RDW: 12.9 % (ref 11.5–15.5)
WBC: 11.3 10*3/uL — ABNORMAL HIGH (ref 4.0–10.5)
nRBC: 0 % (ref 0.0–0.2)

## 2020-08-14 LAB — BASIC METABOLIC PANEL
Anion gap: 10 (ref 5–15)
BUN: 9 mg/dL (ref 6–20)
CO2: 21 mmol/L — ABNORMAL LOW (ref 22–32)
Calcium: 8.2 mg/dL — ABNORMAL LOW (ref 8.9–10.3)
Chloride: 105 mmol/L (ref 98–111)
Creatinine, Ser: 0.84 mg/dL (ref 0.61–1.24)
GFR, Estimated: >60 mL/min (ref >60)
Glucose, Bld: 121 mg/dL — ABNORMAL HIGH (ref 70–99)
Potassium: 3.8 mmol/L (ref 3.5–5.1)
Sodium: 136 mmol/L (ref 135–145)

## 2020-08-14 LAB — MAGNESIUM: Magnesium: 1.4 mg/dL — ABNORMAL LOW (ref 1.7–2.4)

## 2020-08-14 LAB — GLUCOSE, CAPILLARY: Glucose-Capillary: 103 mg/dL — ABNORMAL HIGH (ref 70–99)

## 2020-08-14 MED ORDER — POLYETHYLENE GLYCOL 3350 17 G PO PACK
17.0000 g | PACK | Freq: Every day | ORAL | Status: DC
  Administered 2020-08-14 – 2020-08-15 (×2): 17 g via ORAL
  Filled 2020-08-14 (×2): qty 1

## 2020-08-14 MED ORDER — DOCUSATE SODIUM 100 MG PO CAPS
100.0000 mg | ORAL_CAPSULE | Freq: Two times a day (BID) | ORAL | Status: DC
  Administered 2020-08-14 – 2020-08-15 (×4): 100 mg via ORAL
  Filled 2020-08-14 (×4): qty 1

## 2020-08-14 MED ORDER — MAGNESIUM SULFATE 4 GM/100ML IV SOLN
4.0000 g | Freq: Once | INTRAVENOUS | Status: AC
  Administered 2020-08-14: 4 g via INTRAVENOUS
  Filled 2020-08-14: qty 100

## 2020-08-14 NOTE — Progress Notes (Signed)
Verbal order from Dr.Zhang to downgrade to floor, and pt ok to get cspine films on tele with transport no RN required. Pt to radiology now for imaging, level of care order updated.

## 2020-08-14 NOTE — Progress Notes (Signed)
    Attending Progress Note  History: Jesse Garrett is here for unstable C5-6 and C6-7 injuries.  He is POD1 s/p C5-7 ACDF.  He had a traumatic spinal fluid leak from his injury.  He has neck pain but it otherwise doing well.  His R arm strength has improved.  No significant neck swelling.  Swallowing without difficulty.  Voice strong.  Physical Exam: Vitals:   08/14/20 0900 08/14/20 1000  BP: (!) 148/101 (!) 153/102  Pulse: 95 95  Resp: 13 15  Temp:    SpO2: 94% 98%    AA Ox3 CNI  Strength:5/5 throughout BUE Sensation improved, with diminished tingling in R C7  Neck soft, incision c/d/i  Data:  Recent Labs  Lab 08/12/20 1251 08/12/20 1251 08/13/20 0410 08/13/20 0410 08/14/20 0503  NA 137   < > 140   < > 136  K 3.7   < > 3.4*   < > 3.8  CL 104   < > 110   < > 105  CO2 24   < > 18*   < > 21*  BUN 10   < > 8   < > 9  CREATININE 1.22   < > 0.86   < > 0.84  GLUCOSE 107*   < > 86   < > 121*  CALCIUM 9.8  --  8.9  --  8.2*   < > = values in this interval not displayed.   Recent Labs  Lab 08/12/20 1251  AST 65*  ALT 31  ALKPHOS 54     Recent Labs  Lab 08/12/20 1251 08/12/20 1251 08/13/20 0410 08/13/20 0410 08/14/20 0503  WBC 11.3*   < > 10.5   < > 11.3*  HGB 14.1   < > 12.8*   < > 12.9*  HCT 41.2   < > 37.7*   < > 37.8*  PLT 322  --  258  --  248   < > = values in this interval not displayed.   Recent Labs  Lab 08/12/20 1859  APTT 29  INR 1.0         Other tests/results: none to review  Assessment/Plan:  Jesse Garrett is doing well after ACDF for cervical spine instability.  - mobilize - pain control - DVT prophylaxis - PTOT - dispo planning per primary team - continue HOB elevation until tomorrow   Venetia Night MD, [facility] Department of Neurosurgery

## 2020-08-14 NOTE — Discharge Instructions (Signed)
Your surgeon has performed an operation on your cervical spine (neck) to relieve pressure on the spinal cord and/or nerves. This involved making an incision in the front of your neck and removing one or more of the discs that support your spine. Next, a small piece of bone, a titanium plate, and screws were used to fuse two or more of the vertebrae (bones) together.  The following are instructions to help in your recovery once you have been discharged from the hospital. Even if you feel well, it is important that you follow these activity guidelines. If you do not let your neck heal properly from the surgery, you can increase the chance of return of your symptoms and other complications.  * Do not take anti-inflammatory medications for 3 months after surgery (naproxen [Aleve], ibuprofen [Advil, Motrin], celecoxib [Celebrex], etc.). These medications can prevent your bones from healing properly.  Activity    No bending, lifting, or twisting ("BLT"). Avoid lifting objects heavier than 10 pounds (gallon milk jug).  Where possible, avoid household activities that involve lifting, bending, reaching, pushing, or pulling such as laundry, vacuuming, grocery shopping, and childcare. Try to arrange for help from friends and family for these activities while your back heals.  Increase physical activity slowly as tolerated.  Taking short walks is encouraged, but avoid strenuous exercise. Do not jog, run, bicycle, lift weights, or participate in any other exercises unless specifically allowed by your doctor.  Talk to your doctor before resuming sexual activity.  You should not drive until cleared by your doctor.  Until released by your doctor, you should not return to work or school.  You should rest at home and let your body heal.   You may shower two days after your surgery.  After showering, lightly dab your incision dry. Do not take a tub bath or go swimming until approved by your doctor at your follow-up  appointment.  If your doctor ordered a cervical collar (neck brace) for you, you should wear it whenever you are out of bed. You may remove it when lying down or sleeping, but you should wear it at all other times. Not all neck surgeries require a cervical collar.  If you smoke, we strongly recommend that you quit.  Smoking has been proven to interfere with normal bone healing and will dramatically reduce the success rate of your surgery. Please contact [facility] (800-QUIT-NOW) and use the resources at www.[facility].com for assistance in stopping smoking.  Surgical Incision   Keep your incision area clean and dry.  The glue should begin to peel away within a week or two.  Diet           You may return to your usual diet. However, you may experience discomfort when swallowing in the first month after your surgery. This is normal. You may find that softer foods are more comfortable for you to swallow. Be sure to stay hydrated.  When to Contact us  You may experience pain in your neck and/or pain between your shoulder blades. This is normal and should improve in the next few weeks with the help of pain medication, muscle relaxers, and rest. Some patients report that a warm compress on the back of the neck or between the shoulder blades helps.  However, should you experience any of the following, contact us immediately: . New numbness or weakness . Pain that is progressively getting worse, and is not relieved by your pain medication, muscle relaxers, rest, and warm compresses . Bleeding,  redness, swelling, pain, or drainage from surgical incision . Chills or flu-like symptoms . Fever greater than 101.0 F (38.3 C) . Inability to eat, drink fluids, or take medications . Problems with bowel or bladder functions . Difficulty breathing or shortness of breath . Warmth, tenderness, or swelling in your calf  Contact Information . During office hours (Monday-Friday 9 am to 5 pm), please call  your physician at [phone] and ask for Sharlot Gowda . After hours and weekends, please call [phone] and speak with the answering service, who will contact the doctor on call.  If that fails, call the [facility] Operator at [phone] and ask for the Neurosurgery Resident On Call  . For a life-threatening emergency, call 911

## 2020-08-14 NOTE — Progress Notes (Signed)
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 abdominal aorta. No aneurysmal dilation. Patent abdominal vasculature. No acute vascular process in the abdomen on venous phase. There is no gastrohepatic or hepatoduodenal ligament lymphadenopathy. No retroperitoneal or mesenteric lymphadenopathy. No pelvic sidewall lymphadenopathy. Reproductive: Prostate with mild heterogeneity, nonspecific finding on CT. Other: Small fat containing bilateral inguinal hernias. Stranding about the umbilicus is minimal and may relate to prior herniorrhaphy about the umbilicus. No free air. No ascites. Musculoskeletal: LEFT second rib fracture. Signs of prior trauma to the LEFT hemithorax with healed fracture of the LEFT sixth rib posteriorly. Sternum is intact. Visualized clavicles and scapulae are intact. Spinal degenerative changes without thoracic or lumbar spine abnormality IMPRESSION: 1. Acute LEFT second rib fracture. 2. Some stranding about the RIGHT thoracic inlet at the level of C7 transverse process where there is  fracture. Small amount of stranding along the RIGHT innominate artery and about the RIGHT vertebral artery. Injuries in the cervical spine above the image field of view for the CT of the chest abdomen and pelvis. Given pattern of injuries CT angiography of the neck may be helpful including the arch to assess the vertebral artery. Innominate artery could be further assessed at this time. 3. Basilar airspace disease atelectasis or contusion. 4. No evidence of acute traumatic injury to the abdomen or pelvis. 5. Jejunal intussusception with Short-segment, is favored to represent a transient phenomenon despite being seen on venous and delayed phases. If the patient experiences continued abdominal pain follow-up CT could be performed. 6. Aortic atherosclerosis. Aortic Atherosclerosis (ICD10-I70.0). Electronically Signed   By: Donzetta Kohut M.D.   On: 08/12/2020 15:08   DG C-Arm 1-60 Min  Result Date: 08/13/2020 CLINICAL DATA:  Elective surgery EXAM: CERVICAL SPINE - 2-3 VIEW; DG C-ARM 1-60 MIN COMPARISON:  MRI 08/12/2020 FINDINGS: Multiple intraoperative spot images are submitted demonstrating anterior fusion from C5-C7. No hardware complicating feature. Normal alignment. IMPRESSION: ACDF C5-C7.  No visible complicating feature. Electronically Signed   By: Charlett Nose M.D.   On: 08/13/2020 18:46   DG C-Arm 1-60 Min-No Report  Result Date: 08/13/2020 Fluoroscopy was utilized by the requesting physician.  No radiographic interpretation.   CT Maxillofacial Wo Contrast  Result Date: 08/12/2020 CLINICAL DATA:  Trauma. EXAM: CT HEAD WITHOUT CONTRAST CT MAXILLOFACIAL WITHOUT CONTRAST CT CERVICAL SPINE WITHOUT CONTRAST TECHNIQUE: Multidetector CT imaging of the head, cervical spine, and maxillofacial structures were performed using the standard protocol  without intravenous contrast. Multiplanar CT image reconstructions of the cervical spine and maxillofacial structures were also generated. COMPARISON:  None.  FINDINGS: CT HEAD FINDINGS Brain: No evidence of acute large vessel territory infarction, hemorrhage, hydrocephalus, extra-axial collection or mass lesion/mass effect. Mild patchy white matter hypoattenuation in the white matter, likely related to chronic microvascular ischemic disease. Vascular: No hyperdense vessel or unexpected calcification. Skull: No acute calvarial fracture. Other: No mastoid effusions. CT MAXILLOFACIAL FINDINGS Osseous: Mildly displaced fractures bilateral nasal bones. No other fractures identified. Orbits: Negative. No traumatic or inflammatory finding. Sinuses: The sinuses are clear. Soft tissues: Small left frontal scalp contusion. CT CERVICAL SPINE FINDINGS Alignment: Mild anterolisthesis of C6 on C7. Skull base and vertebrae: Unstable anterior dislocation of the right C6 facet with bilateral C6 lamina fractures, comminuted and extending to the C6-C7 facet joint on the right. Mild anterolisthesis of C6 on C7. Comminuted fracture of the right C7 lateral mass and right transverse process, extending to the facet joint and the right transverse foramen. Acute fracture of the C5 spinous process which extends to involve the left C5 lamina. Soft tissues and spinal canal: No prevertebral fluid or swelling. No large visible canal hematoma. Disc levels:  Mild multilevel degenerative disc disease. Upper chest: Please see concurrent CT chest abdomen pelvis for characterization of the superior mediastinum and chest. IMPRESSION: CT cervical spine: 1. Unstable anterior dislocation of the right C6 facet with bilateral C6 lamina fractures, comminuted and extending to the C6-C7 facet joint on the right. Mild anterolisthesis of C6 on C7, possibly traumatic. 2. Comminuted fracture of the right C7 lateral mass and right transverse process, extending to the facet joint and the right transverse foramen. Recommend CTA of the neck to evaluate for vertebral artery injury. 3. Acute fracture of the C5 spinous process  which extends to involve the left C5 lamina. 4. Please see concurrent CT chest abdomen pelvis for characterization of the superior mediastinum and chest. CT head: 1. No evidence of acute intracranial abnormality. 2. Chronic microvascular ischemic disease. CT maxillofacial: Mildly displaced bilateral nasal bone fractures. No other fractures identified. Findings discussed with Dr. Katrinka Blazing at 3:09 PM via telephone. Electronically Signed   By: Feliberto Harts MD   On: 08/12/2020 15:19        Scheduled Meds: . Chlorhexidine Gluconate Cloth  6 each Topical Daily  . docusate sodium  100 mg Oral BID  . doxepin  100 mg Oral QHS  . fenofibrate  160 mg Oral Daily  . folic acid  1 mg Oral Daily  . lithium carbonate  450 mg Oral QHS  . LORazepam  0-4 mg Intravenous Q6H   Followed by  . LORazepam  0-4 mg Intravenous Q12H  . multivitamin with minerals  1 tablet Oral Daily  . pantoprazole  40 mg Oral BID  . rosuvastatin  5 mg Oral Daily  . thiamine  100 mg Oral Daily   Or  . thiamine  100 mg Intravenous Daily   Continuous Infusions:   LOS: 2 days    Time spent: 25 minutes    Marrion Coy, MD [facility]   To contact the attending provider between 7A-7P or the covering provider during after hours 7P-7A, please log into the web site [email or web address] and access using universal [place] password for that web site. If you do not have the password, please call the hospital operator.  08/14/2020, 1:58 PM

## 2020-08-15 DIAGNOSIS — J9601 Acute respiratory failure with hypoxia: Secondary | ICD-10-CM | POA: Diagnosis not present

## 2020-08-15 DIAGNOSIS — I959 Hypotension, unspecified: Secondary | ICD-10-CM | POA: Diagnosis not present

## 2020-08-15 DIAGNOSIS — S12601A Unspecified nondisplaced fracture of seventh cervical vertebra, initial encounter for closed fracture: Secondary | ICD-10-CM | POA: Diagnosis not present

## 2020-08-15 LAB — BASIC METABOLIC PANEL
Anion gap: 12 (ref 5–15)
BUN: 8 mg/dL (ref 6–20)
CO2: 20 mmol/L — ABNORMAL LOW (ref 22–32)
Calcium: 9 mg/dL (ref 8.9–10.3)
Chloride: 106 mmol/L (ref 98–111)
Creatinine, Ser: 0.73 mg/dL (ref 0.61–1.24)
GFR, Estimated: >60 mL/min (ref >60)
Glucose, Bld: 117 mg/dL — ABNORMAL HIGH (ref 70–99)
Potassium: 3.1 mmol/L — ABNORMAL LOW (ref 3.5–5.1)
Sodium: 138 mmol/L (ref 135–145)

## 2020-08-15 LAB — MAGNESIUM: Magnesium: 1.7 mg/dL (ref 1.7–2.4)

## 2020-08-15 LAB — GLUCOSE, CAPILLARY: Glucose-Capillary: 92 mg/dL (ref 70–99)

## 2020-08-15 MED ORDER — OXYCODONE-ACETAMINOPHEN 5-325 MG PO TABS: 1.0000 | ORAL_TABLET | ORAL | 0 refills | Status: AC | PRN

## 2020-08-15 MED ORDER — MAGNESIUM SULFATE 2 GM/50ML IV SOLN
2.0000 g | Freq: Once | INTRAVENOUS | Status: AC
  Administered 2020-08-15: 2 g via INTRAVENOUS
  Filled 2020-08-15: qty 50

## 2020-08-15 MED ORDER — POTASSIUM CHLORIDE 20 MEQ PO PACK
40.0000 meq | PACK | ORAL | Status: AC
  Administered 2020-08-15 (×2): 40 meq via ORAL
  Filled 2020-08-15 (×2): qty 2

## 2020-08-15 MED ORDER — LACTULOSE 10 GM/15ML PO SOLN
20.0000 g | Freq: Once | ORAL | Status: AC
  Administered 2020-08-15: 20 g via ORAL
  Filled 2020-08-15: qty 30

## 2020-08-15 NOTE — Progress Notes (Signed)
Pt was DC after pt had a BM and HHPT was set up in coordination with [facility]. DC paper was given to pt with the presence of mother.

## 2020-08-15 NOTE — TOC Transition Note (Signed)
Transition of Care [facility]) - CM/SW Discharge Note   Patient Details  Name: Jesse Garrett MRN: [id number] Date of Birth: March 08, 1971  [facility] CM/SW Contact:  Maud Deed, LCSW Phone Number: 08/15/2020, 1:33 PM   Clinical Narrative:    Pt medically stable for discharge per MD. CSW notified pt that there were no agencies that are willing to accept his insurance and notified pt that he could go with an OP Rehab option and he was agreeable. CSW notified MD and completed referral for OP PT.    Final next level of care: OP Rehab Barriers to Discharge: No Barriers Identified   Patient Goals and CMS Choice        Discharge Placement                Patient to be transferred to facility by: Spouse Name of family member notified: Jesse Garrett Patient and family notified of of transfer: 08/15/20  Discharge Plan and Services                                     Social Determinants of Health (SDOH) Interventions     Readmission Risk Interventions No flowsheet data found.
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
## 2020-08-16 ENCOUNTER — Encounter: Payer: Self-pay | Admitting: Neurosurgery

## 2020-10-26 ENCOUNTER — Other Ambulatory Visit: Payer: Self-pay | Admitting: Sports Medicine

## 2020-10-26 DIAGNOSIS — S6991XS Unspecified injury of right wrist, hand and finger(s), sequela: Secondary | ICD-10-CM

## 2020-10-26 DIAGNOSIS — M25531 Pain in right wrist: Secondary | ICD-10-CM

## 2020-10-26 DIAGNOSIS — S62014D Nondisplaced fracture of distal pole of navicular [scaphoid] bone of right wrist, subsequent encounter for fracture with routine healing: Secondary | ICD-10-CM

## 2020-11-18 ENCOUNTER — Ambulatory Visit
Admission: RE | Admit: 2020-11-18 | Discharge: 2020-11-18 | Disposition: A | Discharge status: Home / Self Care | Payer: Medicaid Other | Source: Ambulatory Visit | Attending: Sports Medicine | Admitting: Sports Medicine

## 2020-11-18 ENCOUNTER — Other Ambulatory Visit: Payer: Self-pay

## 2020-11-18 DIAGNOSIS — S6991XD Unspecified injury of right wrist, hand and finger(s), subsequent encounter: Secondary | ICD-10-CM | POA: Diagnosis not present

## 2020-11-18 DIAGNOSIS — S6991XS Unspecified injury of right wrist, hand and finger(s), sequela: Secondary | ICD-10-CM

## 2020-11-18 DIAGNOSIS — S62014D Nondisplaced fracture of distal pole of navicular [scaphoid] bone of right wrist, subsequent encounter for fracture with routine healing: Secondary | ICD-10-CM | POA: Diagnosis not present

## 2020-11-18 DIAGNOSIS — M25531 Pain in right wrist: Secondary | ICD-10-CM | POA: Diagnosis present

## 2020-11-18 MED ORDER — GADOBUTROL 1 MMOL/ML IV SOLN
2.0000 mL | Freq: Once | INTRAVENOUS | Status: AC | PRN
  Administered 2020-11-18: 2 mL

## 2020-11-18 MED ORDER — IOHEXOL 180 MG/ML  SOLN
20.0000 mL | Freq: Once | INTRAMUSCULAR | Status: AC | PRN
  Administered 2020-11-18: 20 mL

## 2020-11-18 MED ORDER — SODIUM CHLORIDE (PF) 0.9 % IJ SOLN
10.0000 mL | INTRAMUSCULAR | Status: DC | PRN
  Administered 2020-11-18: 10 mL

## 2020-11-18 MED ORDER — LIDOCAINE HCL (PF) 1 % IJ SOLN
5.0000 mL | Freq: Once | INTRAMUSCULAR | Status: AC
  Administered 2020-11-18: 5 mL via INTRADERMAL
  Filled 2020-11-18: qty 5

## 2021-04-03 IMAGING — RF DG C-ARM 1-60 MIN
1 series · 6 of 6 positions shown · non-contrast
Comparison: MRI 08/12/2020

CLINICAL DATA: Elective surgery

EXAM:
CERVICAL SPINE - 2-3 VIEW; DG C-ARM 1-60 MIN

[Series 1: dg x-ray · 0.20mm/px · 6 of 6 slices shown]
[im 1/6]
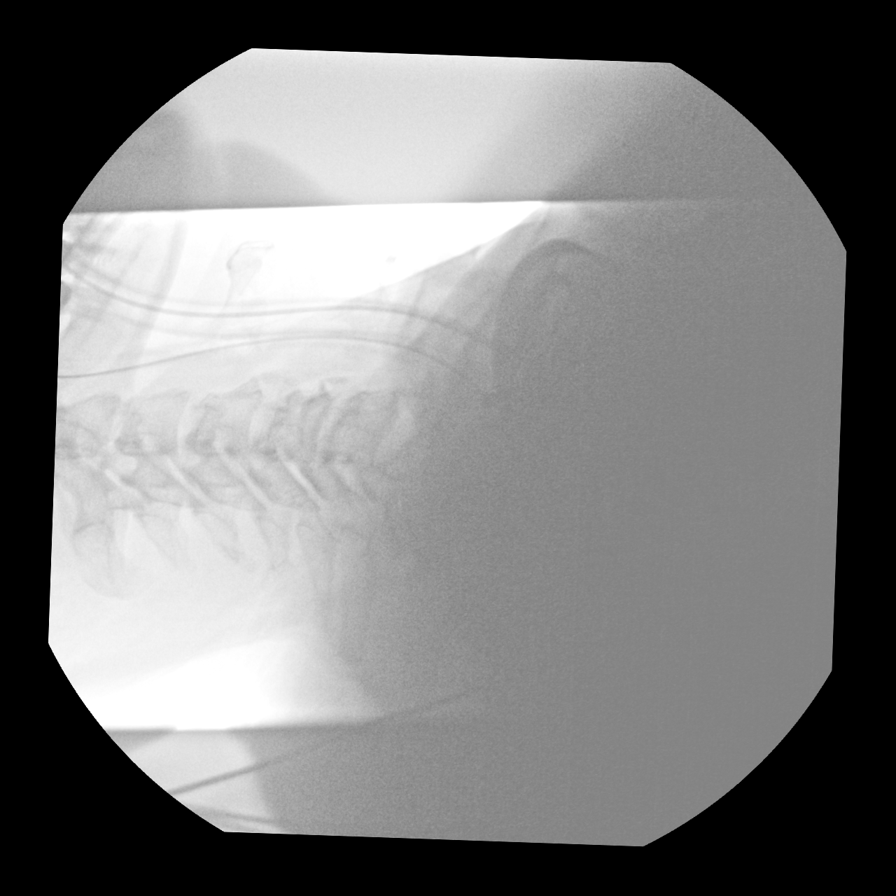
[im 2/6]
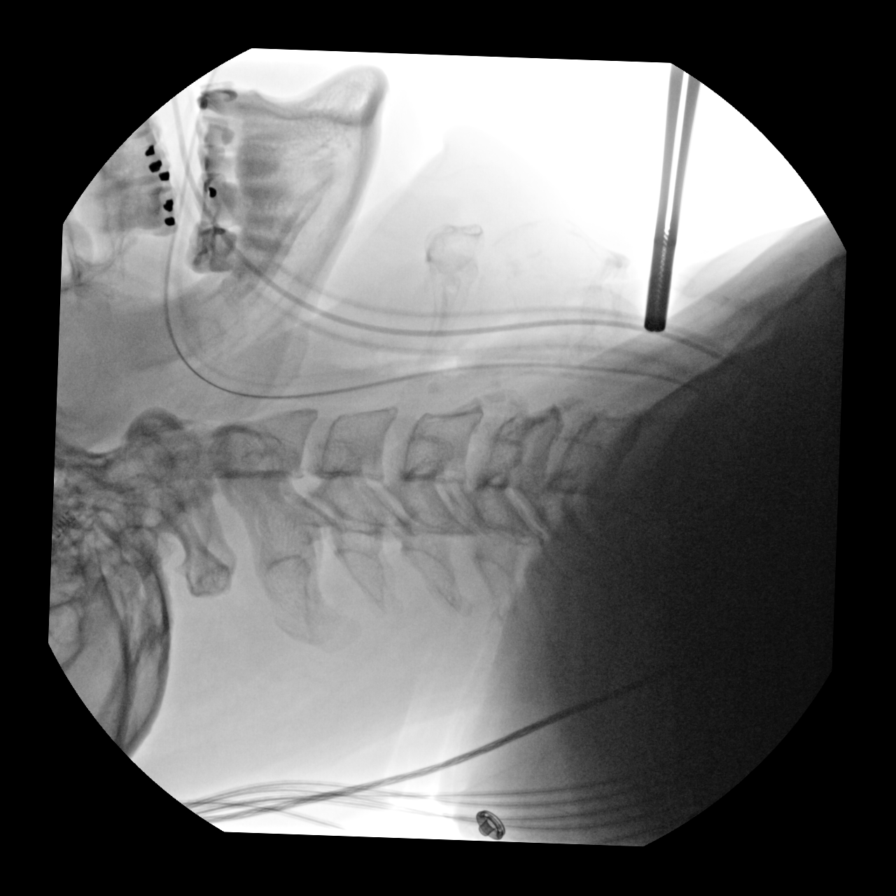
[im 3/6]
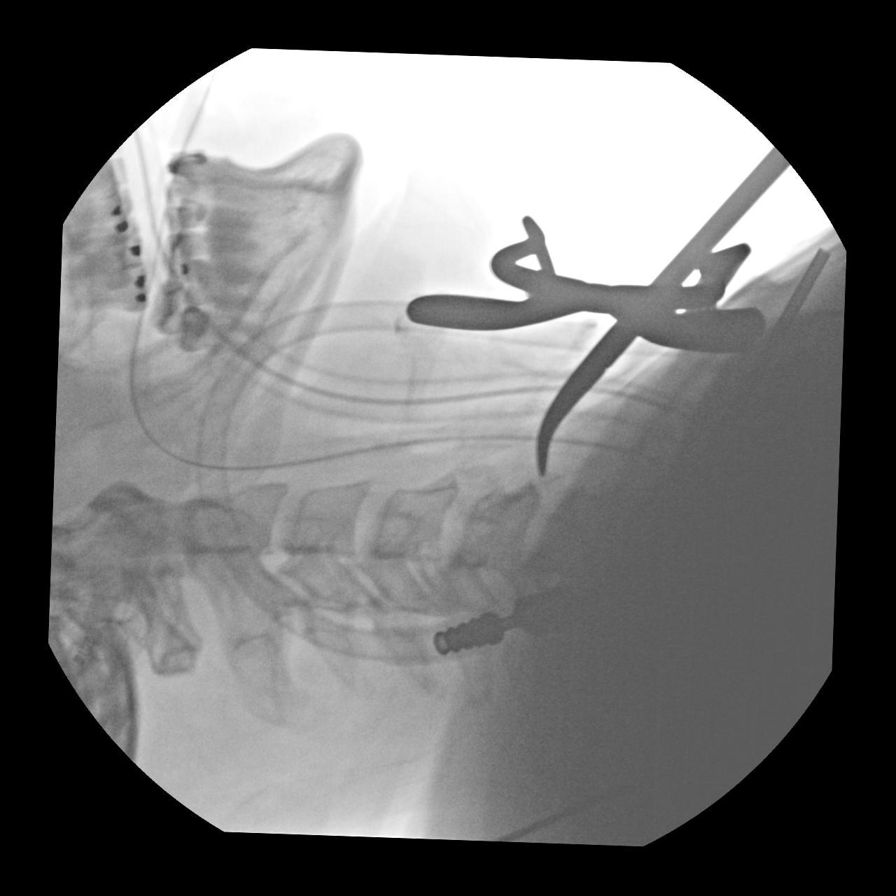
[im 4/6]
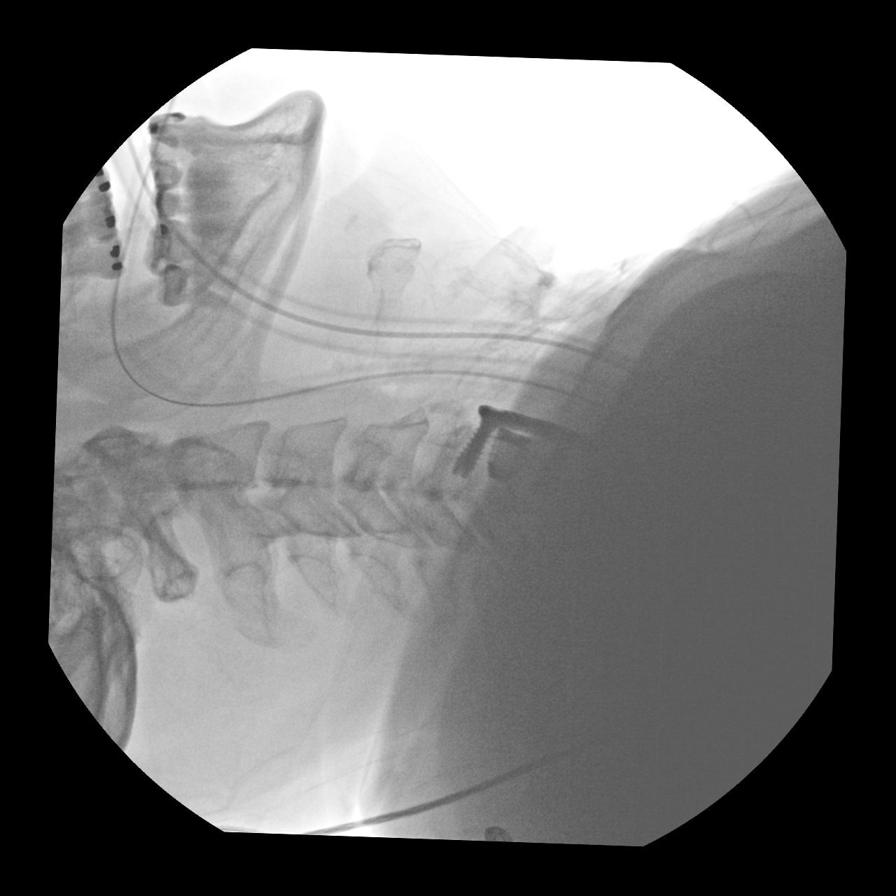
[im 5/6]
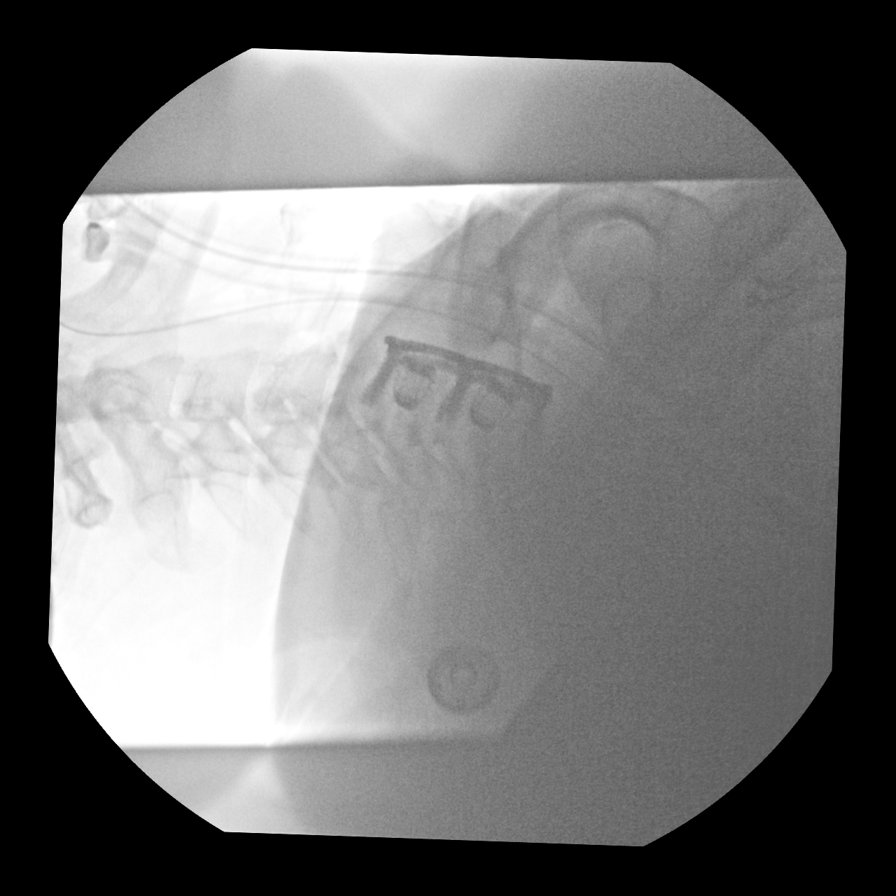
[im 6/6]
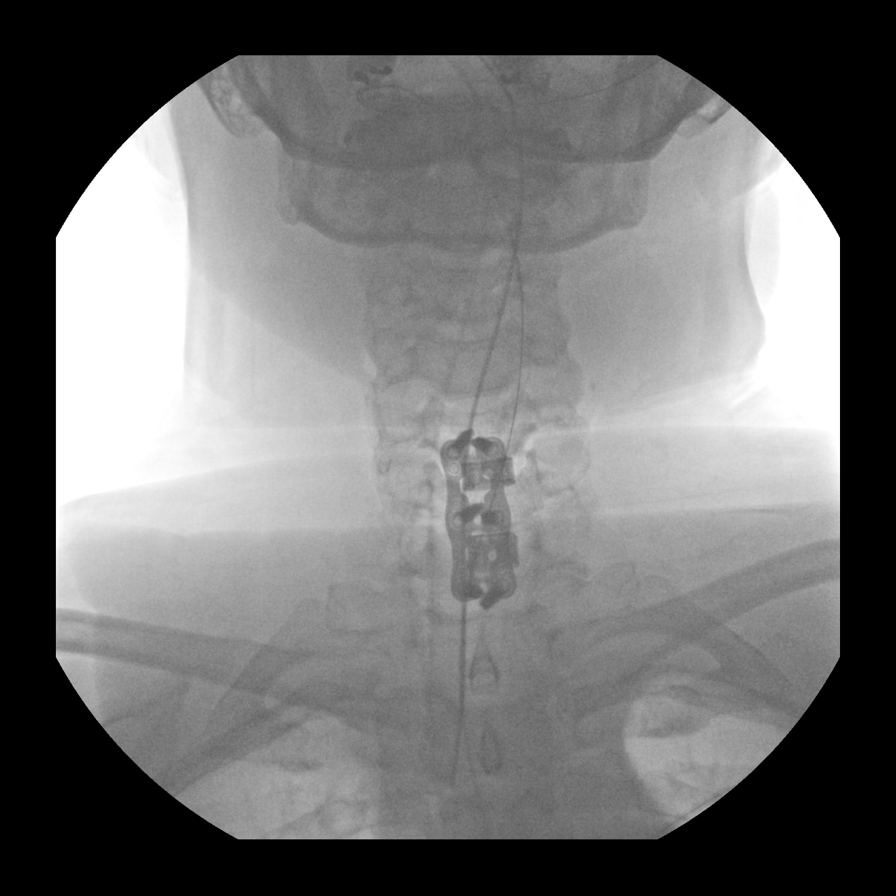

[6 of 6 positions shown; findings below may reference images not displayed]

FINDINGS: Multiple intraoperative spot images are submitted demonstrating
anterior fusion from C5-C7. No hardware complicating feature. Normal
alignment.
IMPRESSION: ACDF C5-C7.  No visible complicating feature.

## 2021-04-03 IMAGING — RF DG CERVICAL SPINE 2 OR 3 VIEWS
1 series · 6 of 6 positions shown · non-contrast
Comparison: MRI 08/12/2020

CLINICAL DATA: Elective surgery

EXAM:
CERVICAL SPINE - 2-3 VIEW; DG C-ARM 1-60 MIN

[Series 1: dg x-ray · 0.20mm/px · 6 of 6 slices shown]
[im 1/6]
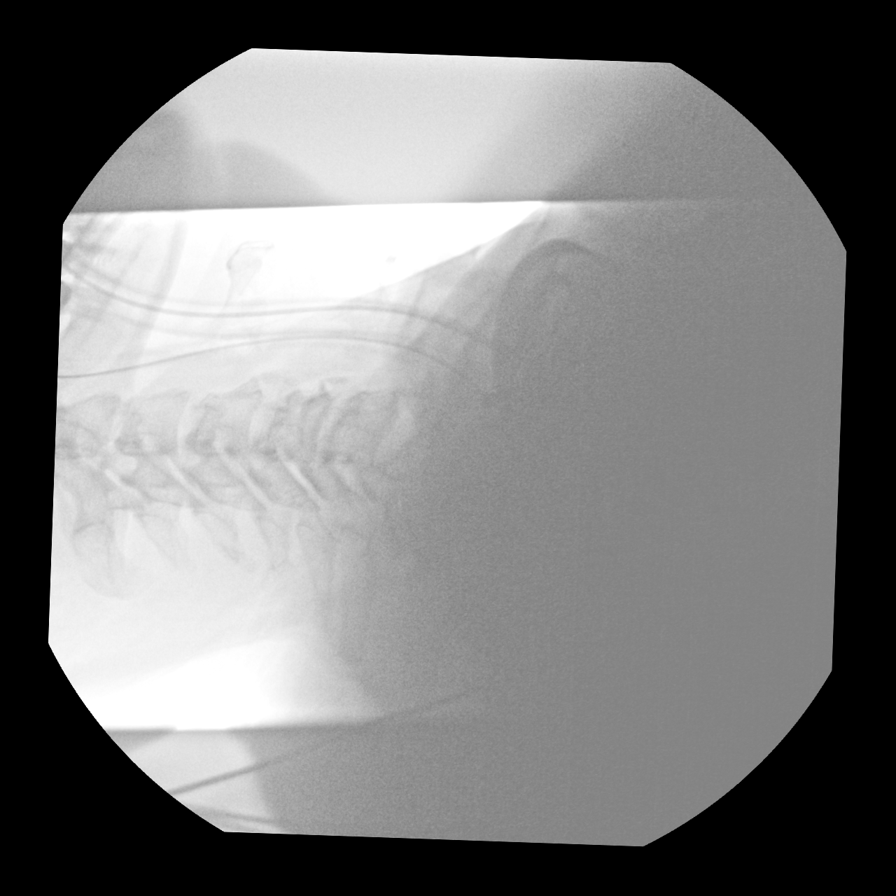
[im 2/6]
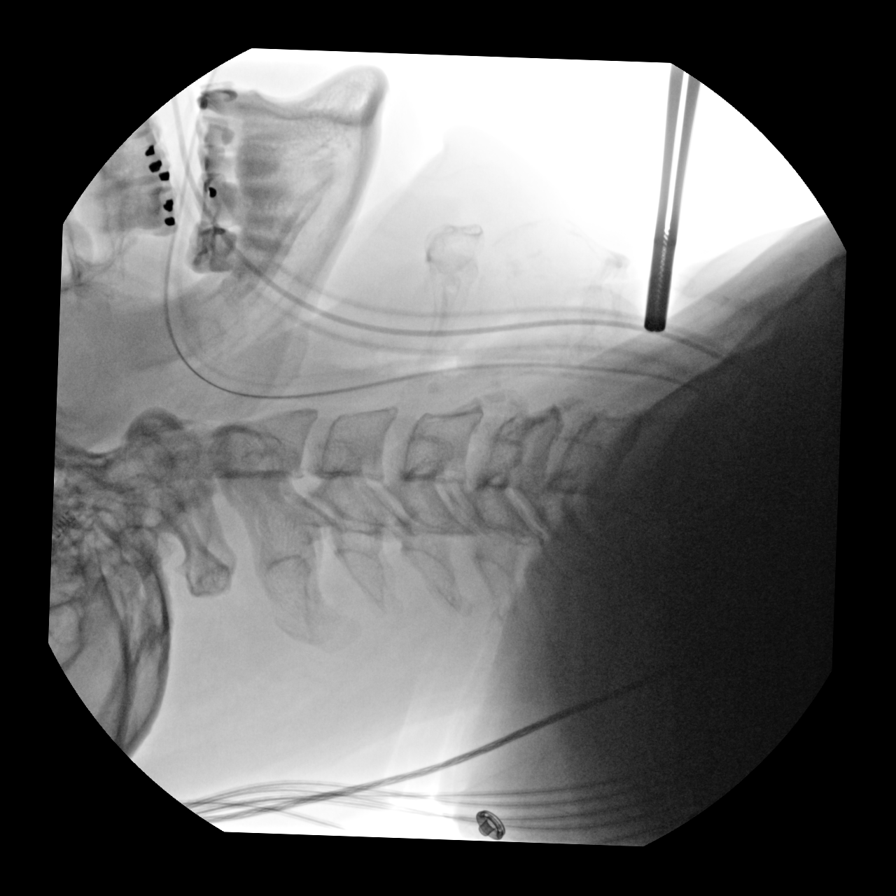
[im 3/6]
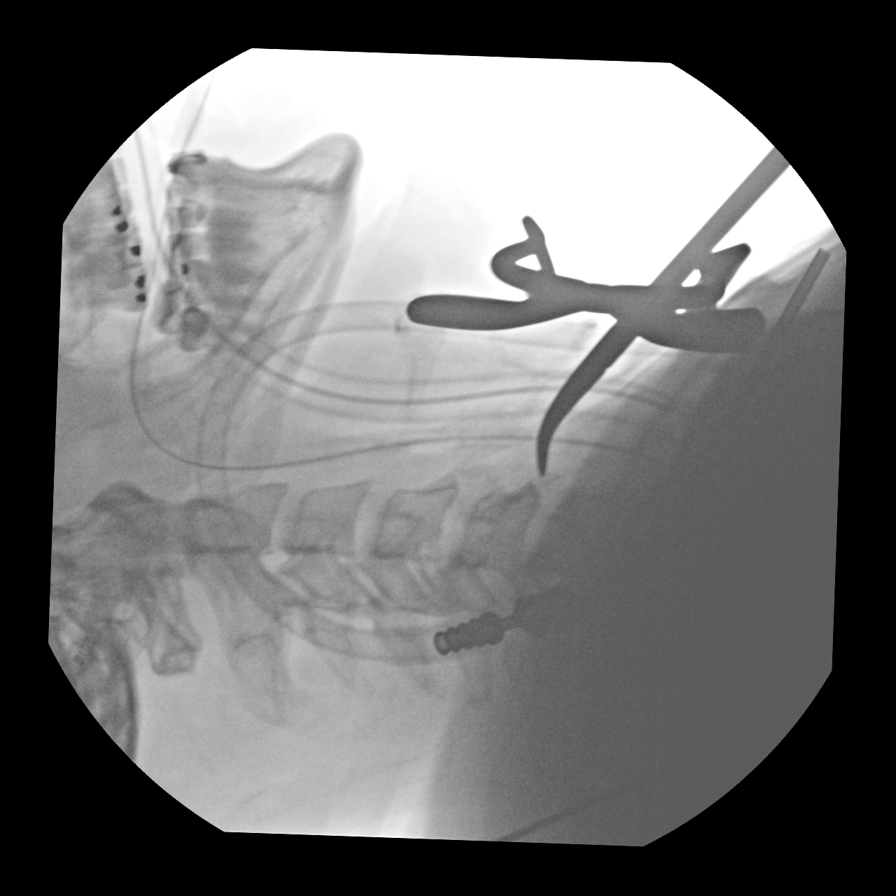
[im 4/6]
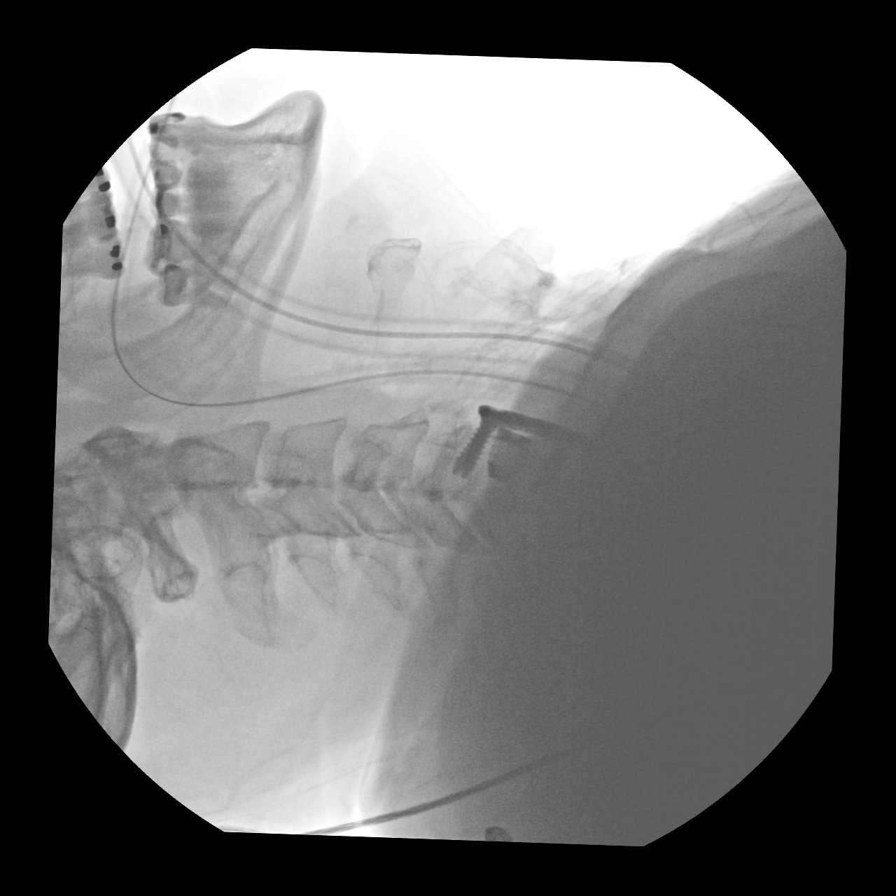
[im 5/6]
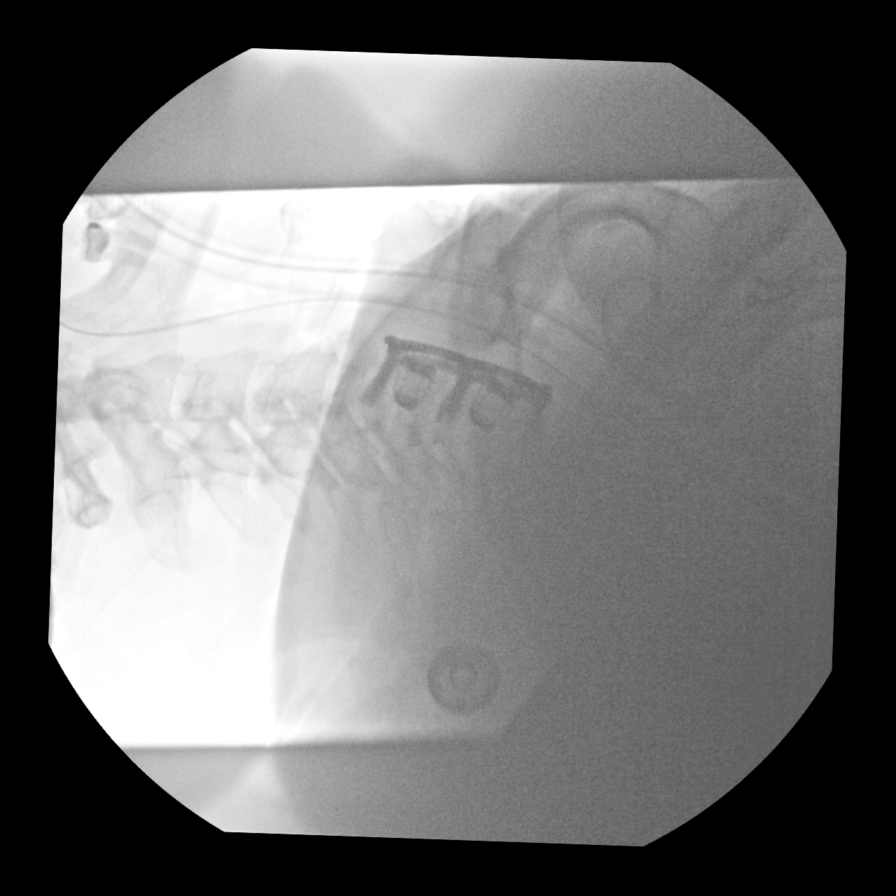
[im 6/6]
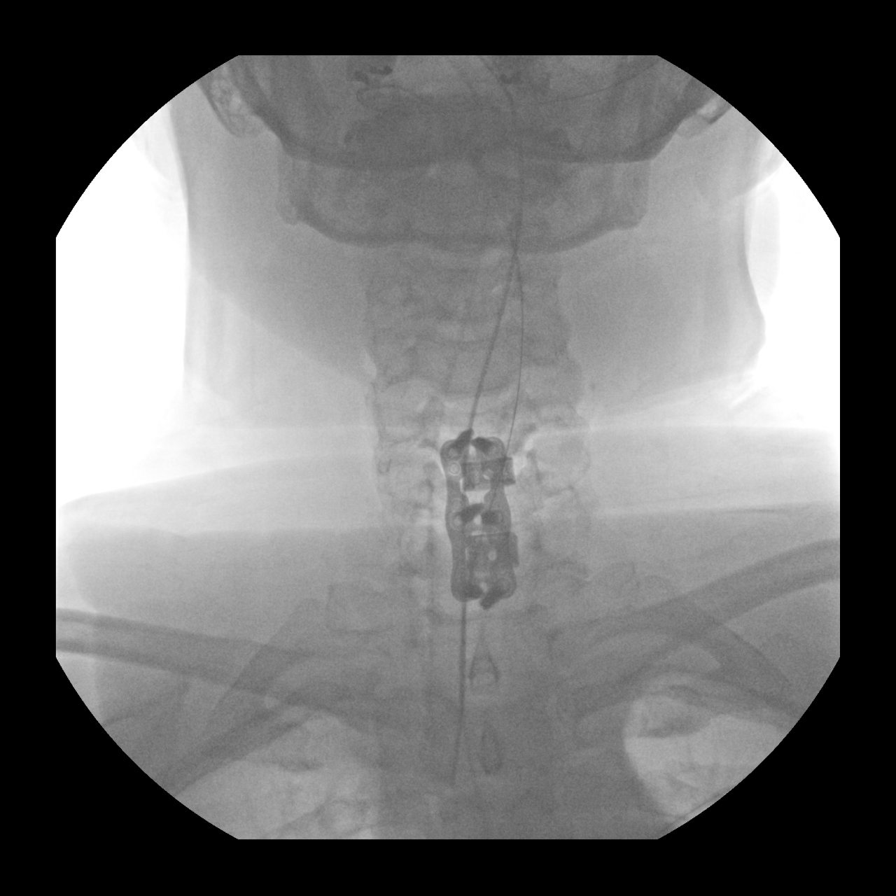

[6 of 6 positions shown; findings below may reference images not displayed]

FINDINGS: Multiple intraoperative spot images are submitted demonstrating
anterior fusion from C5-C7. No hardware complicating feature. Normal
alignment.
IMPRESSION: ACDF C5-C7.  No visible complicating feature.

## 2021-04-04 IMAGING — CR DG CERVICAL SPINE 2 OR 3 VIEWS
1 series · 2 of 2 positions shown · non-contrast
Comparison: 08/13/2020

CLINICAL DATA: Postoperative fusion.

EXAM:
CERVICAL SPINE - 2-3 VIEW

[Series 1: dg cervical spine 2 or 3 views · 0.14mm/px · 2 of 2 slices shown]
[im 1/2]
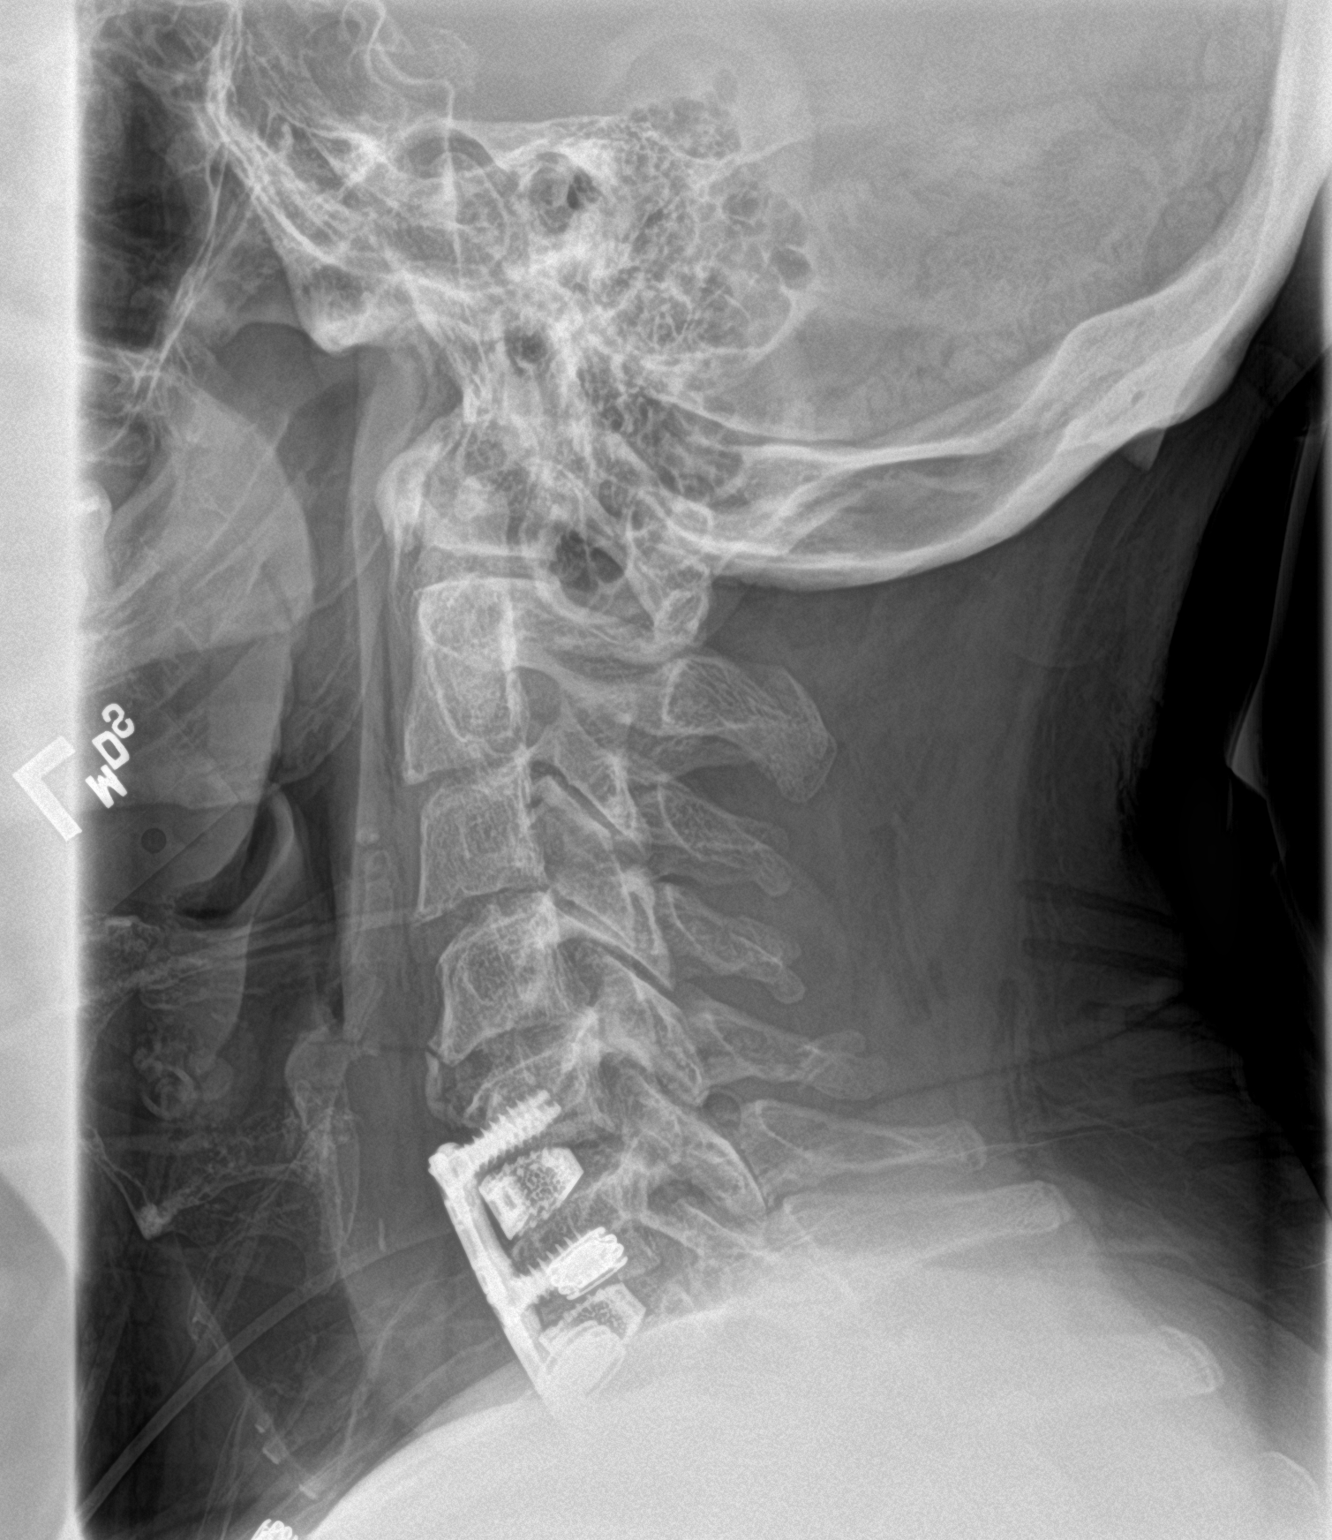
[im 2/2]
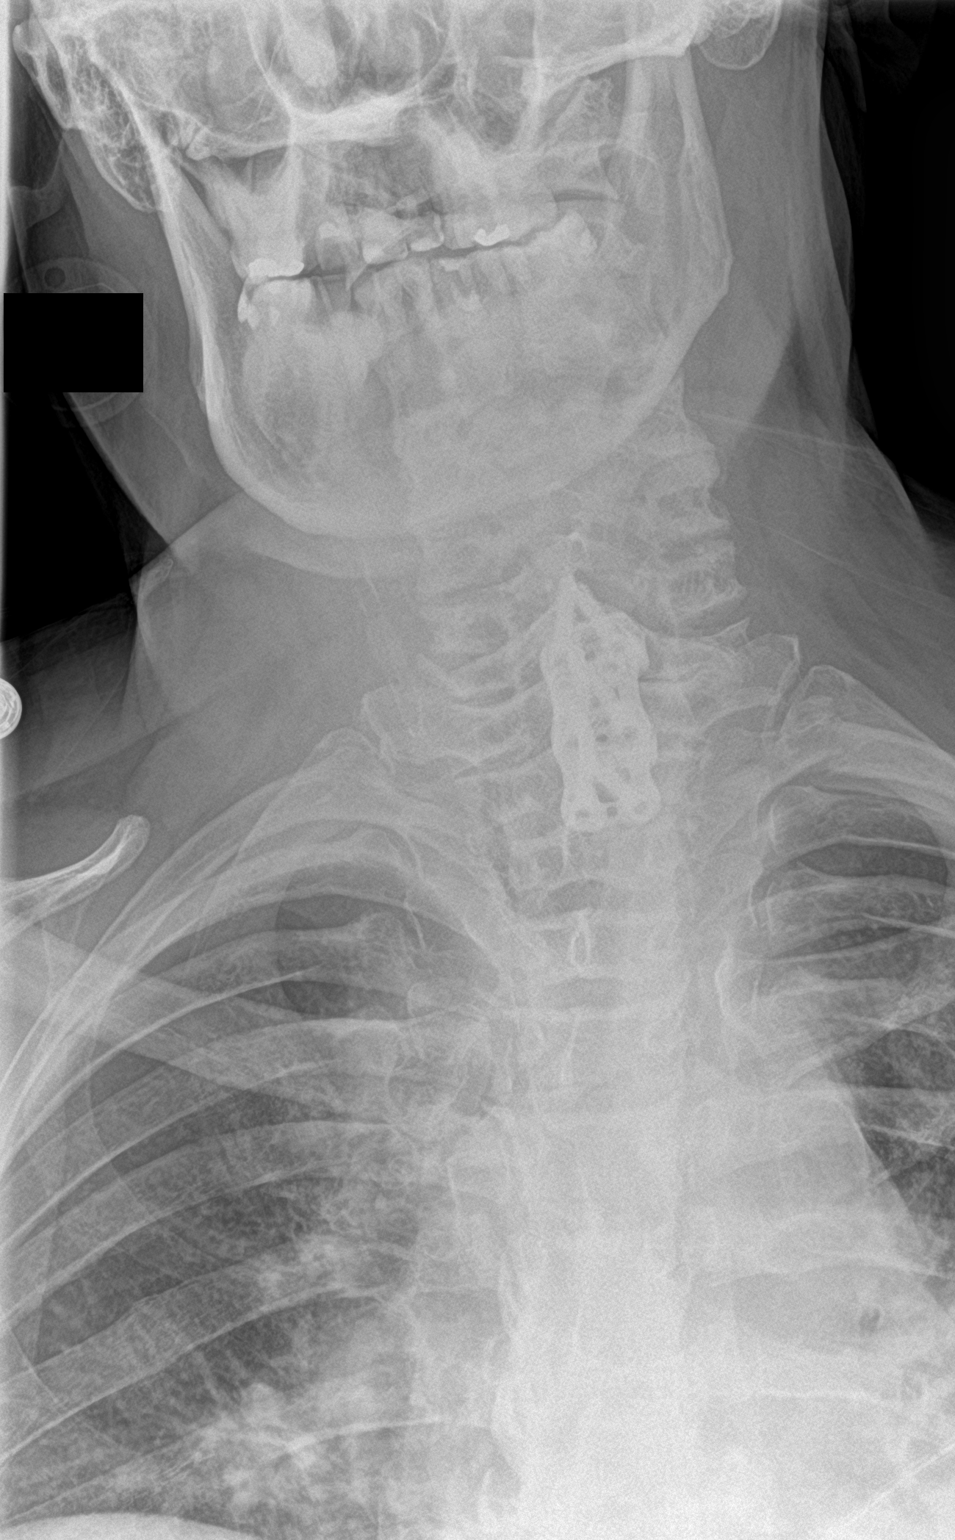

[2 of 2 positions shown; findings below may reference images not displayed]

FINDINGS: Examination demonstrates evidence of patient's recent anterior
fusion from C5-C7 with hardware intact and unchanged. Interbody
fusion at the C5-6 and C6-7 levels. Mild spondylosis throughout the
cervical spine. Prevertebral soft tissues are normal.
IMPRESSION: 1. Anterior fusion C5-C7 with hardware intact and unchanged.
2. Mild spondylosis throughout the cervical spine.

## 2021-09-01 ENCOUNTER — Encounter: Payer: Self-pay | Admitting: Emergency Medicine

## 2021-09-01 ENCOUNTER — Other Ambulatory Visit: Payer: Self-pay

## 2021-09-01 ENCOUNTER — Ambulatory Visit
Admission: EM | Admit: 2021-09-01 | Discharge: 2021-09-01 | Disposition: A | Discharge status: Home / Self Care | Payer: Medicaid Other | Attending: Emergency Medicine | Admitting: Emergency Medicine

## 2021-09-01 DIAGNOSIS — L03031 Cellulitis of right toe: Secondary | ICD-10-CM | POA: Diagnosis not present

## 2021-09-01 MED ORDER — DOXYCYCLINE HYCLATE 100 MG PO CAPS: 100.0000 mg | ORAL_CAPSULE | Freq: Two times a day (BID) | ORAL | 0 refills | Status: AC

## 2021-09-01 NOTE — ED Triage Notes (Signed)
Pt presents today with c/o of pain/redness to right big toe x 10 days. Denies injury.
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
## 2021-09-01 NOTE — Discharge Instructions (Signed)
Take the Doxycycline twice daily with food for 10 days.  Doxycycline will make you more sensitive to sunburn so wear sunscreen when outdoors and reapply it every 90 minutes.  Soak your foot in warm water and Epsom salts 2-3 times a day to help promote drainage.  Use OTC Tylenol and Ibuprofen according to the package instructions as needed for pain.  Return for new or worsening symptoms.
